# Patient Record
Sex: Male | Born: 1975 | Race: White | Hispanic: No | Marital: Married | Smoking: Former smoker
Health system: Southern US, Community
[De-identification: ages and names within clinical notes are randomized; demographics above are authoritative.]

## PROBLEM LIST (undated history)

## (undated) DIAGNOSIS — I10 Essential (primary) hypertension: Secondary | ICD-10-CM

## (undated) DIAGNOSIS — R42 Dizziness and giddiness: Secondary | ICD-10-CM

## (undated) DIAGNOSIS — F418 Other specified anxiety disorders: Secondary | ICD-10-CM

## (undated) DIAGNOSIS — I4711 Inappropriate sinus tachycardia, so stated: Secondary | ICD-10-CM

## (undated) DIAGNOSIS — R000 Tachycardia, unspecified: Secondary | ICD-10-CM

## (undated) DIAGNOSIS — R030 Elevated blood-pressure reading, without diagnosis of hypertension: Secondary | ICD-10-CM

## (undated) DIAGNOSIS — IMO0001 Reserved for inherently not codable concepts without codable children: Secondary | ICD-10-CM

## (undated) HISTORY — DX: Reserved for inherently not codable concepts without codable children: IMO0001

## (undated) HISTORY — DX: Inappropriate sinus tachycardia, so stated: I47.11

## (undated) HISTORY — DX: Elevated blood-pressure reading, without diagnosis of hypertension: R03.0

## (undated) HISTORY — DX: Tachycardia, unspecified: R00.0

## (undated) HISTORY — DX: Other specified anxiety disorders: F41.8

## (undated) HISTORY — DX: Dizziness and giddiness: R42

---

## 2005-06-20 ENCOUNTER — Other Ambulatory Visit: Payer: Self-pay

## 2005-06-20 ENCOUNTER — Emergency Department: Payer: Self-pay | Admitting: Emergency Medicine

## 2008-05-13 ENCOUNTER — Ambulatory Visit: Payer: Self-pay | Admitting: General Practice

## 2008-06-20 ENCOUNTER — Ambulatory Visit: Payer: Self-pay | Admitting: General Practice

## 2011-03-30 ENCOUNTER — Emergency Department: Payer: Self-pay | Admitting: Emergency Medicine

## 2011-03-30 ENCOUNTER — Ambulatory Visit: Payer: Self-pay

## 2011-04-12 ENCOUNTER — Ambulatory Visit (INDEPENDENT_AMBULATORY_CARE_PROVIDER_SITE_OTHER): Payer: BC Managed Care – PPO | Admitting: Cardiovascular Disease

## 2011-04-12 ENCOUNTER — Encounter: Payer: Self-pay | Admitting: Cardiovascular Disease

## 2011-04-12 DIAGNOSIS — R0602 Shortness of breath: Secondary | ICD-10-CM

## 2011-04-12 DIAGNOSIS — R000 Tachycardia, unspecified: Secondary | ICD-10-CM

## 2011-04-12 DIAGNOSIS — Z87891 Personal history of nicotine dependence: Secondary | ICD-10-CM

## 2011-04-12 DIAGNOSIS — R0789 Other chest pain: Secondary | ICD-10-CM

## 2011-04-12 DIAGNOSIS — Z8249 Family history of ischemic heart disease and other diseases of the circulatory system: Secondary | ICD-10-CM

## 2011-04-12 MED ORDER — DILTIAZEM HCL 30 MG PO TABS: 30.0000 mg | ORAL_TABLET | Freq: Three times a day (TID) | ORAL | Status: DC | PRN

## 2011-04-12 MED ORDER — METOPROLOL TARTRATE 25 MG PO TABS: 25.0000 mg | ORAL_TABLET | Freq: Two times a day (BID) | ORAL | Status: DC | PRN

## 2011-04-12 NOTE — Progress Notes (Signed)
Patient ID: Jordan Sampson, male    DOB: 06-Jul-1975, 35 y.o.   MRN: [id number]  HPI Comments: Jordan Sampson is a pleasant 35 year old gentleman, patient of Dr. Dorothey Baseman, who presents for evaluation of recent tachycardia.  He reports over the past 6 months he has had more than 10 episodes of. Durations and fluttering typically they last for 10 minutes at a time and occur on a random basis with no precipitating factor. 2 weeks ago, he had an episode that lasted one hour. He is relatively asymptomatic when he has these episodes and symptoms occur acutely and stop abruptly. He has not been able to alleviate his symptoms by sitting or changing position. His most recent episode he had shortness of breath. He feels the rhythm is fast and strong. He does not think it is irregular.   He has otherwise been feeling well and has no complaints. He is active with no shortness of breath at baseline.  He did go to the emergency room on November 28. EKG showed normal sinus rhythm with a rate of 69 beats per minute with no significant ST or T wave changes. Blood work was normal including negative cardiac enzymes, normal basic metabolic panel with potassium 3.7  EKG today shows normal sinus rhythm with rate 60 beats per minute with no significant ST or T wave changes, no delta waves   Outpatient Encounter Prescriptions as of 04/12/2011  Medication Sig Dispense Refill  . aspirin 81 MG tablet Take 81 mg by mouth daily.        Marland Kitchen diltiazem (CARDIZEM) 30 MG tablet Take 1 tablet (30 mg total) by mouth 3 (three) times daily as needed.  60 tablet  1  . metoprolol tartrate (LOPRESSOR) 25 MG tablet Take 1 tablet (25 mg total) by mouth 2 (two) times daily as needed.  60 tablet  1   Social Hx:  reports that he quit smoking about 12 years ago. His smokeless tobacco use includes Snuff. He reports that he does not drink alcohol or use illicit drugs.   family history includes Coronary artery disease in his mother; Heart disease  in his mother; and Other (age of onset:52) in his mother.   Review of Systems  Constitutional: Negative.   HENT: Negative.   Eyes: Negative.   Respiratory: Positive for shortness of breath.   Cardiovascular: Positive for palpitations.       Tachycardia  Gastrointestinal: Negative.   Musculoskeletal: Negative.   Skin: Negative.   Neurological: Negative.   Hematological: Negative.   Psychiatric/Behavioral: Negative.   All other systems reviewed and are negative.    BP 120/72  Pulse 68  Ht 6\' 4"  (1.93 m)  Wt 173 lb 12 oz (78.812 kg)  BMI 21.15 kg/m2   Physical Exam  Nursing note and vitals reviewed. Constitutional: He is oriented to person, place, and time. He appears well-developed and well-nourished.  HENT:  Head: Normocephalic.  Nose: Nose normal.  Mouth/Throat: Oropharynx is clear and moist.  Eyes: Conjunctivae are normal. Pupils are equal, round, and reactive to light.  Neck: Normal range of motion. Neck supple. No JVD present.  Cardiovascular: Normal rate, regular rhythm, S1 normal, S2 normal, normal heart sounds and intact distal pulses.  Exam reveals no gallop and no friction rub.   No murmur heard. Pulmonary/Chest: Effort normal and breath sounds normal. No respiratory distress. He has no wheezes. He has no rales. He exhibits no tenderness.  Abdominal: Soft. Bowel sounds are normal. He exhibits no distension. There  is no tenderness.  Musculoskeletal: Normal range of motion. He exhibits no edema and no tenderness.  Lymphadenopathy:    He has no cervical adenopathy.  Neurological: He is alert and oriented to person, place, and time. Coordination normal.  Skin: Skin is warm and dry. No rash noted. No erythema.  Psychiatric: He has a normal mood and affect. His behavior is normal. Judgment and thought content normal.           Assessment and Plan

## 2011-04-12 NOTE — Assessment & Plan Note (Signed)
Etiology of his tachycardia is concerning for SVT or atrial tachycardia. It comes on it regularly and infrequent. We have offered a Holter monitor or 30 day event monitor though he would like to monitor his symptoms for now. We have suggested He called our office for a monitor if his symptoms become more frequent.   We have suggested he go to a fire station or obtain an EKG which we when he is in this rhythm and provide [place] with a copy. If SVT is noted, ablation could be attempted. We have provided a prescription for diltiazem 30 mg p.r.n. As well as metoprolol tartrate 25 mg p.r.n. For symptoms that do not resolve in a short period of time.

## 2011-04-12 NOTE — Assessment & Plan Note (Signed)
He reports having a strong family history of coronary artery disease. He was on a statin in the past though stopped this for uncertain reasons. We have talked about ideal cholesterol levels and he is scheduled to have his lipids drawn in the near future on a routine followup with Dr. Dorothey Baseman.

## 2011-04-12 NOTE — Patient Instructions (Signed)
You are doing well. Take metoprolol and diltiazem as needed for tachycardia  You could have SVT or atrial tachycardia  Please call us if you have new issues that need to be addressed before your next appt.

## 2011-04-12 NOTE — Assessment & Plan Note (Signed)
We have complement him on his ability to stop smoking. He continues to dip.

## 2011-12-08 ENCOUNTER — Ambulatory Visit: Payer: Self-pay | Admitting: General Practice

## 2012-05-01 ENCOUNTER — Emergency Department: Payer: Self-pay | Admitting: Emergency Medicine

## 2012-05-01 LAB — CBC
HCT: 42.4 % (ref 40.0–52.0)
MCHC: 34.5 g/dL (ref 32.0–36.0)
MCV: 89 fL (ref 80–100)
RDW: 14 % (ref 11.5–14.5)

## 2012-05-01 LAB — URINALYSIS, COMPLETE
Leukocyte Esterase: NEGATIVE
Nitrite: NEGATIVE
Ph: 7 (ref 4.5–8.0)
Protein: NEGATIVE
Squamous Epithelial: NONE SEEN

## 2012-05-01 LAB — COMPREHENSIVE METABOLIC PANEL
Albumin: 4.5 g/dL (ref 3.4–5.0)
Anion Gap: 7 (ref 7–16)
Bilirubin,Total: 1.6 mg/dL — ABNORMAL HIGH (ref 0.2–1.0)
Glucose: 92 mg/dL (ref 65–99)
Osmolality: 280 (ref 275–301)
Sodium: 141 mmol/L (ref 136–145)
Total Protein: 8.1 g/dL (ref 6.4–8.2)

## 2012-05-04 ENCOUNTER — Ambulatory Visit: Payer: Self-pay | Admitting: General Practice

## 2012-05-07 ENCOUNTER — Ambulatory Visit: Payer: Self-pay | Admitting: General Practice

## 2012-05-08 ENCOUNTER — Ambulatory Visit: Payer: Self-pay | Admitting: Surgery

## 2012-05-08 LAB — COMPREHENSIVE METABOLIC PANEL
Alkaline Phosphatase: 92 U/L (ref 50–136)
Bilirubin,Total: 1.7 mg/dL — ABNORMAL HIGH (ref 0.2–1.0)
Co2: 33 mmol/L — ABNORMAL HIGH (ref 21–32)
Creatinine: 0.97 mg/dL (ref 0.60–1.30)
EGFR (Non-African Amer.): >60
Potassium: 4.6 mmol/L (ref 3.5–5.1)
SGOT(AST): 20 U/L (ref 15–37)
SGPT (ALT): 31 U/L (ref 12–78)
Sodium: 140 mmol/L (ref 136–145)
Total Protein: 8.3 g/dL — ABNORMAL HIGH (ref 6.4–8.2)

## 2012-05-08 LAB — CBC WITH DIFFERENTIAL/PLATELET
Basophil #: 0.1 10*3/uL (ref 0.0–0.1)
Basophil %: 0.8 %
Eosinophil #: 0.1 10*3/uL (ref 0.0–0.7)
HGB: 14.3 g/dL (ref 13.0–18.0)
MCV: 89 fL (ref 80–100)
Monocyte #: 0.7 x10 3/mm (ref 0.2–1.0)
Monocyte %: 7.4 %

## 2012-05-08 LAB — LIPASE, BLOOD: Lipase: 127 U/L (ref 73–393)

## 2012-05-10 ENCOUNTER — Ambulatory Visit: Payer: Self-pay | Admitting: Gastroenterology

## 2012-05-11 ENCOUNTER — Ambulatory Visit: Payer: Self-pay | Admitting: Surgery

## 2012-05-22 ENCOUNTER — Ambulatory Visit: Payer: Self-pay | Admitting: General Practice

## 2012-05-22 LAB — CREATININE, SERUM: EGFR (Non-African Amer.): >60

## 2012-05-23 ENCOUNTER — Ambulatory Visit: Payer: Self-pay | Admitting: General Practice

## 2012-05-23 DIAGNOSIS — I059 Rheumatic mitral valve disease, unspecified: Secondary | ICD-10-CM

## 2012-05-23 DIAGNOSIS — R079 Chest pain, unspecified: Secondary | ICD-10-CM

## 2012-05-29 ENCOUNTER — Encounter: Payer: Self-pay | Admitting: Internal Medicine

## 2012-05-29 ENCOUNTER — Ambulatory Visit (INDEPENDENT_AMBULATORY_CARE_PROVIDER_SITE_OTHER): Payer: BC Managed Care – PPO | Admitting: Internal Medicine

## 2012-05-29 VITALS — BP 137/87 | HR 89 | Ht 76.0 in | Wt 177.2 lb

## 2012-05-29 DIAGNOSIS — R000 Tachycardia, unspecified: Secondary | ICD-10-CM

## 2012-05-29 DIAGNOSIS — F341 Dysthymic disorder: Secondary | ICD-10-CM

## 2012-05-29 DIAGNOSIS — R42 Dizziness and giddiness: Secondary | ICD-10-CM

## 2012-05-29 DIAGNOSIS — R030 Elevated blood-pressure reading, without diagnosis of hypertension: Secondary | ICD-10-CM

## 2012-05-29 DIAGNOSIS — I498 Other specified cardiac arrhythmias: Secondary | ICD-10-CM

## 2012-05-29 DIAGNOSIS — F418 Other specified anxiety disorders: Secondary | ICD-10-CM | POA: Insufficient documentation

## 2012-05-29 DIAGNOSIS — I471 Supraventricular tachycardia: Secondary | ICD-10-CM

## 2012-05-29 NOTE — Assessment & Plan Note (Signed)
As above.

## 2012-05-29 NOTE — Progress Notes (Signed)
ELECTROPHYSIOLOGY CONSULT NOTE  Patient ID: Jordan Sampson, MRN: [id number], DOB/AGE: 1976-02-02 37 y.o. Admit date: (Not on file) Date of Consult: 05/29/2012  Primary Physician: Jordan Reek, MD Primary Cardiologist: Jordan Sampson  Chief Complaint:  spells   HPI Jordan Sampson is a 37 y.o. male seen at the request of Dr Jordan Sampson because of spells. The patient has a history of spells that dates back numbers of months. There has been orthostatic lightheadedness and showers associated lightheadedness. In December he began having spells which were quite different and were characterized by chills and coldness in his arms and his legs accompanied by dizziness and eructation. He in fact required complaint is of some of these occasions  He would have a residual occipital headache. First of these episodes occurred in the next day he was in the emergency room. Evaluation is not clear but apparently there was an elevated transaminase and he was referred for further evaluation of his gallbladder testing of which was unrevealing. He was thought to be having problems with anxiety and depression which antecedent issues but has not therapy before and he was treated with anxiolytics and antidepressants; there has been no interval improvement.  He was unable to work for a week and half. She has significant dizziness that seems sometimes to be worse lying and sitting or standing.  There was a vertiginous symptoms these episodes.  He came to cardiac attention because of his complaints of chest pain. His electrocardiogram was essentially unremarkable except for a small the flexion of the ST segment of lead V1. He was admitted for treadmill testing in his heart rate went from a baseline of 84-176 2 minutes and the blood pressure went from 154-201. He's had problems with diaphoresis of his hands and feet.  We don't have blood work available from Jordan Sampson but I presume that thyroid studies and CBC are  normal.  Echocardiogram normal with both structure and function. Brain MRI was also normal.     Past Medical History  Diagnosis Date  . Arrhythmia     undetermined cardiac arrhythmia      Surgical History: No past surgical history on file.   Home Meds: Prior to Admission medications   Medication Sig Start Date End Date Taking? Authorizing Provider  clonazePAM (KLONOPIN) 0.5 MG tablet Take 0.25 mg by mouth 2 (two) times daily.   Yes Historical Provider, MD  esomeprazole (NEXIUM) 20 MG capsule Take 20 mg by mouth daily before breakfast.   Yes Historical Provider, MD  loratadine (CLARITIN) 10 MG tablet Take 10 mg by mouth daily.   Yes Historical Provider, MD  sertraline (ZOLOFT) 100 MG tablet Take 100 mg by mouth daily.   Yes Historical Provider, MD      Allergies: No Known Allergies  History   Social History  . Marital Status: Married    Spouse Name: N/A    Number of Children: N/A  . Years of Education: N/A   Occupational History  . Not on file.   Social History Main Topics  . Smoking status: Former Smoker -- 1.0 packs/day for 4 years    Quit date: 04/12/1999  . Smokeless tobacco: Current User    Types: Snuff     Comment: 1 Can daily  . Alcohol Use: No  . Drug Use: No  . Sexually Active: Not on file   Other Topics Concern  . Not on file   Social History Narrative  . No narrative on file     Family  History  Problem Relation Age of Onset  . Coronary artery disease Mother     less than 38 yo  . Other Mother 49    triple bypass surgery  . Heart disease Mother      ROS:  Please see the history of present illness. All other systems reviewed and negative.    Physical Exam:  Blood pressure 137/87, pulse 89, height 6\' 4"  (1.93 m), weight 177 lb 3.2 oz (80.377 kg). General: Well developed, well nourished male in no acute distress. Head: Normocephalic, atraumatic, sclera non-icteric, no xanthomas, nares are without discharge. Lymph Nodes:  none Back:  without scoliosis/kyphosis , no CVA tendersness Neck: Negative for carotid bruits. JVD not elevated. Lungs: Clear bilaterally to auscultation without wheezes, rales, or rhonchi. Breathing is unlabored. Heart: RRR with S1 S2. No murmur , rubs, or gallops appreciated. Abdomen: Soft, non-tender, non-distended with normoactive bowel sounds. No hepatomegaly. No rebound/guarding. No obvious abdominal masses. Msk:  Strength and tone appear normal for age. Extremities: No clubbing or cyanosis. No edema.  Distal pedal pulses are 2+ and equal bilaterally. Skin: Warm and Dry Neuro: Alert and oriented X 3. CN III-XII intact Grossly normal sensory and motor function . Psych:  Responds to questions appropriately with a normal affect.      Labs:  Radiology/Studies:  No results found.  EKG:  ECG demonstrates sinus rhythm at 65 Intervals 15/09/39 Perhaps a small R. Prime in lead V1 and  Assessment and Plan:   Jordan Sampson

## 2012-05-29 NOTE — Patient Instructions (Signed)
Your physician has recommended that you wear an event monitor. Event monitors are medical devices that record the heart's electrical activity. Doctors most often [place] these monitors to diagnose arrhythmias. Arrhythmias are problems with the speed or rhythm of the heartbeat. The monitor is a small, portable device. You can wear one while you do your normal daily activities. This is usually used to diagnose what is causing palpitations/syncope (passing out).  Your physician recommends that you have lab work : 24 hour urine collection for metanephrines & catecholamines  Your physician recommends that you schedule a follow-up appointment in: 4 weeks with Dr. Graciela Husbands in the [place] office.

## 2012-05-29 NOTE — Assessment & Plan Note (Addendum)
The patient is having spells that I don't quite understand. The objective finding of profound tachycardia with standing at 2 minutes of exercise associated with hypertension is concerning for postural tachycardia perhaps the hyper adrenergic form, i.e. POTS  Is also some concern that there may be a hyperadrenergic process going on we will measure urine catecholamines and metanephrines to exclude a circulating issue as he also has hypertension.  The breath of his complaints however with the chills extremity pains in his symptoms worse with lying down however create a constellation of symptoms a with which I am unfamiliar.  aThe association of dysautonomia syndrome with anxiety and depression was reviewed the importance of addressing these issues are ongoing basis was reiterated.  We discussed the physiology of autonomic syndrome for POTS. We've discussed the importance of fluid repletion. Will also repeat a 24-hour urine study as well as use a monitor given the fact that something about these are very episodicand we want to  make sure it we are not missing a primary arrhythmia disorder which I think is unlikely.  Meals and she was undertaken for testing measuring supine and upright catecholamine/norepinephrine levels

## 2012-05-30 ENCOUNTER — Telehealth: Payer: Self-pay | Admitting: *Deleted

## 2012-05-30 NOTE — Telephone Encounter (Signed)
Event monitor enrolled to be mailed to pt. 05/30/12 TK

## 2012-05-31 ENCOUNTER — Encounter: Payer: Self-pay | Admitting: Internal Medicine

## 2012-05-31 ENCOUNTER — Other Ambulatory Visit (INDEPENDENT_AMBULATORY_CARE_PROVIDER_SITE_OTHER): Payer: BC Managed Care – PPO

## 2012-05-31 DIAGNOSIS — I471 Supraventricular tachycardia: Secondary | ICD-10-CM

## 2012-05-31 DIAGNOSIS — R0989 Other specified symptoms and signs involving the circulatory and respiratory systems: Secondary | ICD-10-CM

## 2012-06-01 ENCOUNTER — Other Ambulatory Visit: Payer: BC Managed Care – PPO

## 2012-06-03 LAB — CATECHOLAMINES, FRACTIONATED, URINE, 24 HOUR
Creatinine, Urine mg/day-CATEUR: 0.93 g/(24.h) (ref 0.63–2.50)
Epinephrine, 24 hr Urine: 8 mcg/24 h (ref 2–24)

## 2012-06-05 LAB — METANEPHRINES, URINE, 24 HOUR: Metaneph Total, Ur: 187 mcg/24 h (ref 115–695)

## 2012-07-03 ENCOUNTER — Ambulatory Visit (INDEPENDENT_AMBULATORY_CARE_PROVIDER_SITE_OTHER): Payer: BC Managed Care – PPO | Admitting: Internal Medicine

## 2012-07-03 ENCOUNTER — Encounter: Payer: Self-pay | Admitting: Internal Medicine

## 2012-07-03 VITALS — BP 130/70 | HR 67 | Ht 76.0 in | Wt 181.8 lb

## 2012-07-03 DIAGNOSIS — R002 Palpitations: Secondary | ICD-10-CM

## 2012-07-03 MED ORDER — PROPRANOLOL HCL ER 60 MG PO CP24: 60.0000 mg | ORAL_CAPSULE | Freq: Every day | ORAL | Status: DC

## 2012-07-03 NOTE — Progress Notes (Signed)
Patient Care Team: Fulton Reek, MD as PCP - General (Unknown Physician Specialty)   HPI  Jordan Sampson is a 37 y.o. 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 We have treated him with salt and water repletion and he is much better. He is also taking antidepressants. He is back at work. His worst time of the day is the morning when he awakens.  Past Medical History  Diagnosis Date  . Inappropriate sinus tachycardia     associated with exercise  . Lightheadedness -spells   . Anxiety associated with depression   . Elevated blood pressure     History reviewed. No pertinent past surgical history.  Current Outpatient Prescriptions  Medication Sig Dispense Refill  . clonazePAM (KLONOPIN) 0.5 MG tablet Take 0.25 mg by mouth 2 (two) times daily.      Marland Kitchen esomeprazole (NEXIUM) 20 MG capsule Take 20 mg by mouth daily before breakfast.      . loratadine (CLARITIN) 10 MG tablet Take 10 mg by mouth daily.      . sertraline (ZOLOFT) 100 MG tablet Take 100 mg by mouth daily.       No current facility-administered medications for this visit.    No Known Allergies  Review of Systems negative except from HPI and PMH  Physical Exam BP 130/70  Pulse 67  Ht 6\' 4"  (1.93 m)  Wt 181 lb 12 oz (82.441 kg)  BMI 22.13 kg/m2 Well developed and nourished in no acute distress HENT normal Neck supple with JVP-flat Clear Regular rate and rhythm, no murmurs or gallops Abd-soft with active BS No Clubbing cyanosis edema Skin-warm and dry A & Oriented  Grossly normal sensory and motor function  Event recorder demonstrates sinus tachycardia and nonspecific arrhythmias ECG demonstrates sinus rhythm at 67 Intervals 15/10/38 Axis is 80 Suggesting right atrial enlargement   Assessment and  Plan

## 2012-07-03 NOTE — Assessment & Plan Note (Addendum)
The patient is much improved on a strategy predicated with diagnosis of dysautonomia and POTS. We will have him liberalize his sodium intake a little bit but with caution given his elevated blood pressures. We'll also give him a prescription for Inderal LA 60 to try to see if that doesn't help take the edge of sinus tachycardia palpitations.  We discussed the impact of ambient heat on symptoms of dysautonomia. We also discussed raising the head of the bed 2-6inches nd drinking a glass of water prior to arising to try to minimize am symptoms

## 2012-07-03 NOTE — Patient Instructions (Addendum)
Your physician wants you to follow-up in: 3 months with Dr. Graciela Husbands. You will receive a reminder letter in the mail two months in advance. If you don't receive a letter, please call our office to schedule the follow-up appointment.  Your physician has recommended you make the following change in your medication:  -Inderal ER 60 mg daily

## 2012-07-03 NOTE — Assessment & Plan Note (Signed)
As above.

## 2012-08-12 ENCOUNTER — Ambulatory Visit: Payer: Self-pay | Admitting: Physician Assistant

## 2012-08-12 LAB — RAPID STREP-A WITH REFLX: Micro Text Report: NEGATIVE

## 2012-10-04 ENCOUNTER — Ambulatory Visit (INDEPENDENT_AMBULATORY_CARE_PROVIDER_SITE_OTHER): Payer: BC Managed Care – PPO | Admitting: Internal Medicine

## 2012-10-04 ENCOUNTER — Encounter: Payer: Self-pay | Admitting: Internal Medicine

## 2012-10-04 VITALS — BP 112/82 | HR 68 | Ht 76.0 in | Wt 173.0 lb

## 2012-10-04 DIAGNOSIS — F341 Dysthymic disorder: Secondary | ICD-10-CM

## 2012-10-04 DIAGNOSIS — R030 Elevated blood-pressure reading, without diagnosis of hypertension: Secondary | ICD-10-CM

## 2012-10-04 DIAGNOSIS — I498 Other specified cardiac arrhythmias: Secondary | ICD-10-CM

## 2012-10-04 DIAGNOSIS — F418 Other specified anxiety disorders: Secondary | ICD-10-CM

## 2012-10-04 DIAGNOSIS — R000 Tachycardia, unspecified: Secondary | ICD-10-CM

## 2012-10-04 NOTE — Assessment & Plan Note (Signed)
Improved. Continue on his SSRI. Is not taking Klonopin

## 2012-10-04 NOTE — Assessment & Plan Note (Signed)
Much improved. He is a low dose Inderal. Another alternative might be clonidine he had significant symptoms

## 2012-10-04 NOTE — Progress Notes (Signed)
Patient Care Team: Fulton Reek, MD as PCP - General (Unknown Physician Specialty)   HPI  Jordan Sampson is a 37 y.o. 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 We have treated him with salt and water repletion and he is much better.   Rx with inderal LA 60 for palps   He continues to feel well. He has some complaints of tingling in his fingertips and toes. This is most notable when he is driving.is also some perioral paresthesias     Past Medical History  Diagnosis Date  . Inappropriate sinus tachycardia     associated with exercise  . Lightheadedness -spells   . Anxiety associated with depression   . Elevated blood pressure     History reviewed. No pertinent past surgical history.  Current Outpatient Prescriptions  Medication Sig Dispense Refill  . esomeprazole (NEXIUM) 20 MG capsule Take 20 mg by mouth daily before breakfast.      . loratadine (CLARITIN) 10 MG tablet Take 10 mg by mouth daily.      . propranolol ER (INDERAL LA) 60 MG 24 hr capsule Take 1 capsule (60 mg total) by mouth daily.  90 capsule  3  . sertraline (ZOLOFT) 100 MG tablet Take 100 mg by mouth daily.       No current facility-administered medications for this visit.    No Known Allergies  Review of Systems negative except from HPI and PMH  Physical Exam BP 112/82  Pulse 68  Ht 6\' 4"  (1.93 m)  Wt 173 lb (78.472 kg)  BMI 21.07 kg/m2 Well developed and nourished in no acute distress HENT normal Neck supple with JVP-flat Clear Regular rate and rhythm, no murmurs or gallops Abd-soft with active BS No Clubbing cyanosis edema Skin-warm and dry A & Oriented  Grossly normal sensory and motor function  ECG demonstrates sinus rhythm at 68 Intervals 15/10/38 Axis 80  Assessment and  Plan

## 2012-10-04 NOTE — Assessment & Plan Note (Signed)
improved

## 2015-07-14 ENCOUNTER — Other Ambulatory Visit: Payer: Self-pay | Admitting: Physician Assistant

## 2015-10-09 ENCOUNTER — Ambulatory Visit: Payer: BLUE CROSS/BLUE SHIELD | Admitting: Anesthesiology

## 2015-10-09 ENCOUNTER — Ambulatory Visit
Admission: RE | Admit: 2015-10-09 | Discharge: 2015-10-09 | Disposition: A | Discharge status: Home / Self Care | Payer: BLUE CROSS/BLUE SHIELD | Source: Ambulatory Visit | Attending: Unknown Physician Specialty | Admitting: Unknown Physician Specialty

## 2015-10-09 ENCOUNTER — Encounter
Admission: RE | Disposition: A | Discharge status: Home / Self Care | Payer: Self-pay | Source: Ambulatory Visit | Attending: Unknown Physician Specialty

## 2015-10-09 ENCOUNTER — Encounter: Payer: Self-pay | Admitting: *Deleted

## 2015-10-09 DIAGNOSIS — Z800 Family history of malignant neoplasm of digestive organs: Secondary | ICD-10-CM | POA: Diagnosis not present

## 2015-10-09 DIAGNOSIS — K648 Other hemorrhoids: Secondary | ICD-10-CM | POA: Insufficient documentation

## 2015-10-09 DIAGNOSIS — I10 Essential (primary) hypertension: Secondary | ICD-10-CM | POA: Diagnosis not present

## 2015-10-09 DIAGNOSIS — Z87891 Personal history of nicotine dependence: Secondary | ICD-10-CM | POA: Insufficient documentation

## 2015-10-09 DIAGNOSIS — Z1211 Encounter for screening for malignant neoplasm of colon: Secondary | ICD-10-CM | POA: Diagnosis present

## 2015-10-09 DIAGNOSIS — F418 Other specified anxiety disorders: Secondary | ICD-10-CM | POA: Diagnosis not present

## 2015-10-09 DIAGNOSIS — Z79899 Other long term (current) drug therapy: Secondary | ICD-10-CM | POA: Insufficient documentation

## 2015-10-09 HISTORY — DX: Essential (primary) hypertension: I10

## 2015-10-09 HISTORY — PX: COLONOSCOPY WITH PROPOFOL: SHX5780

## 2015-10-09 SURGERY — COLONOSCOPY WITH PROPOFOL
Anesthesia: General

## 2015-10-09 MED ORDER — PROPOFOL 500 MG/50ML IV EMUL
INTRAVENOUS | Status: DC | PRN
  Administered 2015-10-09: 100 ug/kg/min via INTRAVENOUS

## 2015-10-09 MED ORDER — PROPOFOL 10 MG/ML IV BOLUS
INTRAVENOUS | Status: DC | PRN
  Administered 2015-10-09: 50 mg via INTRAVENOUS

## 2015-10-09 MED ORDER — FENTANYL CITRATE (PF) 100 MCG/2ML IJ SOLN
INTRAMUSCULAR | Status: DC | PRN
  Administered 2015-10-09: 50 ug via INTRAVENOUS

## 2015-10-09 MED ORDER — MIDAZOLAM HCL 5 MG/5ML IJ SOLN
INTRAMUSCULAR | Status: DC | PRN
  Administered 2015-10-09: 1 mg via INTRAVENOUS

## 2015-10-09 MED ORDER — LIDOCAINE HCL (PF) 2 % IJ SOLN
INTRAMUSCULAR | Status: DC | PRN
  Administered 2015-10-09: 50 mg

## 2015-10-09 MED ORDER — SODIUM CHLORIDE 0.9 % IV SOLN: INTRAVENOUS | Status: DC

## 2015-10-09 MED ORDER — SODIUM CHLORIDE 0.9 % IV SOLN
INTRAVENOUS | Status: DC
  Administered 2015-10-09: 1000 mL via INTRAVENOUS

## 2015-10-09 NOTE — Anesthesia Preprocedure Evaluation (Signed)
Anesthesia Evaluation  Patient identified by MRN, date of birth, ID band Patient awake    Reviewed: Allergy & Precautions, H&P , NPO status , Patient's Chart, lab work & pertinent test results, reviewed documented beta blocker date and time   History of Anesthesia Complications Negative for: history of anesthetic complications  Airway Mallampati: I  TM Distance: >3 FB Neck ROM: full    Dental no notable dental hx. (+) Caps, Teeth Intact   Pulmonary neg pulmonary ROS, former smoker,    Pulmonary exam normal breath sounds clear to auscultation       Cardiovascular Exercise Tolerance: Good (-) hypertension(-) angina(-) CAD, (-) Past MI, (-) Cardiac Stents and (-) CABG Normal cardiovascular exam+ dysrhythmias (Inappropraite [place]cardia assoc with exercise) + Valvular Problems/Murmurs (as a child, grew out of it)  Rhythm:regular Rate:Normal     Neuro/Psych PSYCHIATRIC DISORDERS (Depression and anxiety) negative neurological ROS     GI/Hepatic Neg liver ROS, GERD  ,  Endo/Other  negative endocrine ROS  Renal/GU negative Renal ROS  negative genitourinary   Musculoskeletal   Abdominal   Peds  Hematology negative hematology ROS (+)   Anesthesia Other Findings Past Medical History:   Inappropriate sinus tachycardia (HCC)                          Comment:associated with exercise   Lightheadedness -spells                                      Anxiety associated with depression                           Elevated blood pressure                                      Hypertension                                                 Reproductive/Obstetrics negative OB ROS                             Anesthesia Physical Anesthesia Plan  ASA: II  Anesthesia Plan: General   Post-op Pain Management:    Induction:   Airway Management Planned:   Additional Equipment:   Intra-op Plan:   Post-operative  Plan:   Informed Consent: I have reviewed the patients History and Physical, chart, labs and discussed the procedure including the risks, benefits and alternatives for the proposed anesthesia with the patient or authorized representative who has indicated his/her understanding and acceptance.   Dental Advisory Given  Plan Discussed with: Anesthesiologist, CRNA and Surgeon  Anesthesia Plan Comments:         Anesthesia Quick Evaluation

## 2015-10-09 NOTE — H&P (Signed)
    Primary Care Physician:  Pcp Not In System Primary Gastroenterologist:  Dr. Mechele CollinElliott  Pre-Procedure History & Physical: HPI:  Jordan Sampson is a 40 y.o. male is here for an colonoscopy.   Past Medical History  Diagnosis Date  . Inappropriate sinus tachycardia (HCC)     associated with exercise  . Lightheadedness -spells   . Anxiety associated with depression   . Elevated blood pressure   . Hypertension     History reviewed. No pertinent past surgical history.  Prior to Admission medications   Medication Sig Start Date End Date Taking? Authorizing Provider  esomeprazole (NEXIUM) 20 MG capsule Take 20 mg by mouth daily before breakfast.   Yes Historical Provider, MD  loratadine (CLARITIN) 10 MG tablet Take 10 mg by mouth daily.   Yes Historical Provider, MD  sertraline (ZOLOFT) 100 MG tablet Take 100 mg by mouth daily.   Yes Historical Provider, MD  propranolol ER (INDERAL LA) 60 MG 24 hr capsule Take 1 capsule (60 mg total) by mouth daily. Patient not taking: Reported on 10/09/2015 07/03/12   Duke SalviaSteven C Klein, MD    Allergies as of 09/25/2015  . (No Known Allergies)    Family History  Problem Relation Age of Onset  . Coronary artery disease Mother     less than 42 yo  . Other Mother [id number]    triple bypass surgery  . Heart disease Mother     Social History   Social History  . Marital Status: Married    Spouse Name: N/A  . Number of Children: N/A  . Years of Education: N/A   Occupational History  . Not on file.   Social History Main Topics  . Smoking status: Former Smoker -- 1.00 packs/day for 4 years    Quit date: 04/12/1999  . Smokeless tobacco: Current User    Types: Snuff     Comment: 1 Can daily  . Alcohol Use: No  . Drug Use: No  . Sexual Activity: Not on file   Other Topics Concern  . Not on file   Social History Narrative    Review of Systems: See HPI, otherwise negative ROS  Physical Exam: BP 114/77 mmHg  Pulse 67  Temp(Src) 97.2 F (36.2  C) (Tympanic)  Resp 18  Ht 6\' 4"  (1.93 m)  Wt 90.719 kg (200 lb)  BMI 24.35 kg/m2  SpO2 98% General:   Alert,  pleasant and cooperative in NAD Head:  Normocephalic and atraumatic. Neck:  Supple; no masses or thyromegaly. Lungs:  Clear throughout to auscultation.    Heart:  Regular rate and rhythm. Abdomen:  Soft, nontender and nondistended. Normal bowel sounds, without guarding, and without rebound.   Neurologic:  Alert and  oriented x4;  grossly normal neurologically.  Impression/Plan: Jordan Sampson is here for an colonoscopy to be performed for FH colon cancer in father.  Risks, benefits, limitations, and alternatives regarding  colonoscopy have been reviewed with the patient.  Questions have been answered.  All parties agreeable.   Lynnae PrudeELLIOTT, Linzee Depaul, MD  10/09/2015, 2:06 PM

## 2015-10-09 NOTE — Op Note (Signed)
[facility] Gastroenterology Patient Name: Jordan Sampson Procedure Date: 10/09/2015 2:08 PM MRN: [id number] Account #: [id number] Date of Birth: [birth year] Admit Type: Outpatient Age: 40 Room: [facility] ENDO ROOM 4 Gender: Male Note Status: Finalized Procedure:            Colonoscopy Indications:          Screening for colorectal malignant neoplasm Providers:            Scot Junobert T. Trent Theisen, MD Referring MD:         Bunnie Philipsonsuelo D. Rabinovich (Referring MD) Medicines:            Propofol per Anesthesia Complications:        No immediate complications. Procedure:            Pre-Anesthesia Assessment:                       - After reviewing the risks and benefits, the patient                        was deemed in satisfactory condition to undergo the                        procedure.                       After obtaining informed consent, the colonoscope was                        passed under direct vision. Throughout the procedure,                        the patient's blood pressure, pulse, and oxygen                        saturations were monitored continuously. The                        Colonoscope was introduced through the anus and                        advanced to the the cecum, identified by appendiceal                        orifice and ileocecal valve. The colonoscopy was                        performed without difficulty. The patient tolerated the                        procedure well. The quality of the bowel preparation                        was good. Findings:      Internal hemorrhoids were found during endoscopy. The hemorrhoids were       small and Grade I (internal hemorrhoids that do not prolapse).      The exam was otherwise without abnormality. Impression:           - Internal hemorrhoids.                       - The examination was otherwise  normal.                       - No specimens collected. Recommendation:       - Repeat colonoscopy in 5 years  for screening purposes                        due to family history of colon cancer.Scot Jun, MD 10/09/2015 2:41:15 PM This report has been signed electronically. Number of Addenda: 0 Note Initiated On: 10/09/2015 2:08 PM Scope Withdrawal Time: 0 hours 12 minutes 20 seconds  Total Procedure Duration: 0 hours 22 minutes 56 seconds       [facility]

## 2015-10-09 NOTE — Transfer of Care (Signed)
Immediate Anesthesia Transfer of Care Note  Patient: Jordan Sampson  Procedure(s) Performed: Procedure(s): COLONOSCOPY WITH PROPOFOL (N/A)  Patient Location: PACU  Anesthesia Type:General  Level of Consciousness: sedated  Airway & Oxygen Therapy: Patient Spontanous Breathing and Patient connected to nasal cannula oxygen  Post-op Assessment: Report given to RN and Post -op Vital signs reviewed and stable  Post vital signs: Reviewed and stable  Last Vitals:  Filed Vitals:   10/09/15 1330  BP: 114/77  Pulse: 67  Temp: 36.2 C  Resp: 18    Last Pain: There were no vitals filed for this visit.       Complications: No apparent anesthesia complications

## 2015-10-10 NOTE — Anesthesia Postprocedure Evaluation (Signed)
Anesthesia Post Note  Patient: Jordan Sampson  Procedure(s) Performed: Procedure(s) (LRB): COLONOSCOPY WITH PROPOFOL (N/A)  Patient location during evaluation: Endoscopy Anesthesia Type: General Level of consciousness: awake and alert Pain management: pain level controlled Vital Signs Assessment: post-procedure vital signs reviewed and stable Respiratory status: spontaneous breathing, nonlabored ventilation, respiratory function stable and patient connected to nasal cannula oxygen Cardiovascular status: blood pressure returned to baseline and stable Postop Assessment: no signs of nausea or vomiting Anesthetic complications: no    Last Vitals:  Filed Vitals:   10/09/15 1501 10/09/15 1511  BP: 118/81 125/77  Pulse: 65 63  Temp:    Resp: 11 20    Last Pain: There were no vitals filed for this visit.               Lenard SimmerAndrew Taleeya Blondin

## 2015-10-12 ENCOUNTER — Encounter: Payer: Self-pay | Admitting: Unknown Physician Specialty

## 2016-01-23 DIAGNOSIS — R000 Tachycardia, unspecified: Secondary | ICD-10-CM | POA: Insufficient documentation

## 2016-01-23 DIAGNOSIS — F418 Other specified anxiety disorders: Secondary | ICD-10-CM | POA: Insufficient documentation

## 2016-01-23 DIAGNOSIS — I4711 Inappropriate sinus tachycardia, so stated: Secondary | ICD-10-CM | POA: Insufficient documentation

## 2016-01-23 DIAGNOSIS — IMO0001 Reserved for inherently not codable concepts without codable children: Secondary | ICD-10-CM | POA: Insufficient documentation

## 2018-02-15 ENCOUNTER — Emergency Department: Payer: BLUE CROSS/BLUE SHIELD

## 2018-02-15 ENCOUNTER — Emergency Department
Admission: EM | Admit: 2018-02-15 | Discharge: 2018-02-15 | Disposition: A | Discharge status: Home / Self Care | Payer: BLUE CROSS/BLUE SHIELD | Attending: Emergency Medicine | Admitting: Emergency Medicine

## 2018-02-15 ENCOUNTER — Encounter: Payer: Self-pay | Admitting: Emergency Medicine

## 2018-02-15 DIAGNOSIS — R42 Dizziness and giddiness: Secondary | ICD-10-CM | POA: Diagnosis not present

## 2018-02-15 DIAGNOSIS — G44309 Post-traumatic headache, unspecified, not intractable: Secondary | ICD-10-CM | POA: Diagnosis not present

## 2018-02-15 DIAGNOSIS — I10 Essential (primary) hypertension: Secondary | ICD-10-CM | POA: Diagnosis not present

## 2018-02-15 DIAGNOSIS — F0781 Postconcussional syndrome: Secondary | ICD-10-CM | POA: Insufficient documentation

## 2018-02-15 DIAGNOSIS — Y9389 Activity, other specified: Secondary | ICD-10-CM | POA: Diagnosis not present

## 2018-02-15 DIAGNOSIS — Y999 Unspecified external cause status: Secondary | ICD-10-CM | POA: Insufficient documentation

## 2018-02-15 DIAGNOSIS — M25521 Pain in right elbow: Secondary | ICD-10-CM | POA: Insufficient documentation

## 2018-02-15 DIAGNOSIS — Z79899 Other long term (current) drug therapy: Secondary | ICD-10-CM | POA: Insufficient documentation

## 2018-02-15 DIAGNOSIS — S161XXA Strain of muscle, fascia and tendon at neck level, initial encounter: Secondary | ICD-10-CM | POA: Diagnosis not present

## 2018-02-15 DIAGNOSIS — R55 Syncope and collapse: Secondary | ICD-10-CM | POA: Diagnosis not present

## 2018-02-15 DIAGNOSIS — Y92410 Unspecified street and highway as the place of occurrence of the external cause: Secondary | ICD-10-CM | POA: Insufficient documentation

## 2018-02-15 DIAGNOSIS — S199XXA Unspecified injury of neck, initial encounter: Secondary | ICD-10-CM | POA: Diagnosis present

## 2018-02-15 DIAGNOSIS — Z87891 Personal history of nicotine dependence: Secondary | ICD-10-CM | POA: Diagnosis not present
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
## 2018-11-17 DIAGNOSIS — Z20828 Contact with and (suspected) exposure to other viral communicable diseases: Secondary | ICD-10-CM | POA: Diagnosis not present

## 2018-11-21 ENCOUNTER — Telehealth: Payer: Self-pay

## 2018-11-21 NOTE — Telephone Encounter (Signed)
In the last 24 hours have you experienced any of these symptoms . Difficulty breathing - no . Nasal congestion - yes . New cough - yes . Runny nose - no . Shortness of breath - no . New sore throat - np . Unexplained body aches - yes  . Nausea or vomiting - no  . Diarrhea - yes . Loss of taste or smell - no . Fever (temp>100 F/37.8 C) or chills - last day fever sunday  Exposure:  . Have you been in close contact with someone with confirmed diagnosis of COVID or person under going testing in past 14 days? Last week some at work was positive, in and out of shop wearing mask.  . Have you been tested for COVID? If yes date, location, results in known - tested on 11/17/18 at [facility] urgent care, doesn't have results yet.  . Have you been living in the same home as a person with confirmed diagnosis of COVID or a person undergoing testing? (household contact) no   . Have you been diagnosed with COVID or are you waiting on test results? Waiting test results  . Have you traveled anywhere in the past 4 weeks? If yes where - no   Pt currently taking tylenol, IBU took tylenol at noon today. Everyone in the shop was sent for testing. His sx started Friday night. Has been out of work since last Thursday.  He is wanting to know what he can take for sx and if he needs to do anything else but wait for results.

## 2018-11-21 NOTE — Telephone Encounter (Signed)
Pt aware, will hold note until we have results

## 2018-11-21 NOTE — Telephone Encounter (Signed)
Continue with symptomatic treatment and to let us know of results when they return. Tylenol for fevers, can use a mucinex or robitussin type product for congestion/cough. Increased fluids important and rest and immodium product ok for diarrhea short term prn.

## 2018-11-22 NOTE — Telephone Encounter (Signed)
Pt advised that HD will call him and if he has questions he can call me back. I will call and check on him in a few days.

## 2018-11-22 NOTE — Telephone Encounter (Signed)
Pt called back today and his COVID test is positive [facility] advised pt HD will be in contact.

## 2018-11-22 NOTE — Telephone Encounter (Signed)
The HD will be helpful with return to work (the [employer] guidelines have very recently changed and I sent them to you via recent email). We need to follow those guidelines at a minimum pending further HD input.

## 2018-11-26 NOTE — Telephone Encounter (Signed)
Pt will take guidance from HD as to when to return to work

## 2018-11-28 ENCOUNTER — Telehealth: Payer: Self-pay | Admitting: Internal Medicine

## 2018-11-28 ENCOUNTER — Telehealth: Payer: Self-pay

## 2018-11-28 NOTE — Telephone Encounter (Signed)
See next note

## 2018-11-28 NOTE — Telephone Encounter (Signed)
Pt called states that HD called and said he can come off isolation. He has had sx for 12 days now, tested positive on 11/21/2018. HD told him he is no longer contagious  But to follow up with [place] about returning to work.  He is still having congestion and body aches and says he doesn't feel great. Fever free X 5 days but has been taking tylenol as recent as yesterday.   Advised that guidelines state he needs to be fever free off meds for 24 hours and needs to be 10 days since sx started. He is just concerned since he still is congested and has body aches if he should return to work

## 2018-11-28 NOTE — Telephone Encounter (Signed)
Patient called and left VM and returned my call. He was tested for Covid Sat 7/18 and informed + on 7/22. He noted this was the sickest he has been and is still struggling with getting his strength back, still congested some and weak, cough is better, no more fevers, no body aches presently. No loss of smell or taste. Not taking any meds presently. He is concerned with trying to go back to work just yet.  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 Of note, his wife was also + and she has not been symptomatic.  The HD noted to him he no longer needs to remain in quarantine and suggested contactin [place] about RTW.   Given how he feels and how sick he was, I agree that rushing back to work is not best and probably needs a few more days before returning. Covid can persist for some longer than a typical infection has been noted.  Agreed to continue with symptomatic management with rest and fluids important part of that, and will tentatively plan on return to work on Monday 12/03/2018. Form completed. If not improving and feeling better to return by then, he is to notify us and if more concerning sx's arise or worsening again, may need to f/u more urgently to an ER setting.

## 2018-11-28 NOTE — Telephone Encounter (Signed)
Message reviewed. Patient was Covid positive 11/21/2018. Noted still having some sx's and had questions about return to work.  I called to talk to him at 11:40 am and left a VM to return my call.

## 2018-11-30 NOTE — Telephone Encounter (Signed)
Pt is feeling better and plans to RTW on Monday 12/03/2018 he has note from HD.

## 2019-01-31 ENCOUNTER — Ambulatory Visit: Payer: Self-pay

## 2019-01-31 DIAGNOSIS — Z23 Encounter for immunization: Secondary | ICD-10-CM

## 2019-03-05 ENCOUNTER — Other Ambulatory Visit: Payer: Self-pay

## 2019-03-05 DIAGNOSIS — F329 Major depressive disorder, single episode, unspecified: Secondary | ICD-10-CM

## 2019-03-05 DIAGNOSIS — F32A Depression, unspecified: Secondary | ICD-10-CM

## 2019-03-06 MED ORDER — SERTRALINE HCL 100 MG PO TABS: 200.0000 mg | ORAL_TABLET | Freq: Every day | ORAL | 0 refills | Status: DC

## 2019-03-06 NOTE — Telephone Encounter (Signed)
Patient had face to face with Dr Roxan Hockey Feb 2020.  On sertraline since 2014 per paper chart review at [facility].  Electronic Rx sent for patient refill.  Due labs/appt Feb/Mar 2021 sooner if new or worsening symptoms.

## 2019-04-17 ENCOUNTER — Other Ambulatory Visit: Payer: Self-pay

## 2019-04-22 ENCOUNTER — Other Ambulatory Visit: Payer: Self-pay | Admitting: Physician Assistant

## 2019-04-22 MED ORDER — MONTELUKAST SODIUM 10 MG PO TABS: 10.0000 mg | ORAL_TABLET | Freq: Every day | ORAL | 1 refills | Status: DC

## 2019-04-22 NOTE — Progress Notes (Signed)
UTD visits Saw Dr Roxan Hockey 07/1998 Uses Rx for rhinitis/allergies daily

## 2019-06-05 ENCOUNTER — Other Ambulatory Visit: Payer: Self-pay

## 2019-06-05 ENCOUNTER — Ambulatory Visit: Payer: Self-pay

## 2019-06-05 DIAGNOSIS — Z0000 Encounter for general adult medical examination without abnormal findings: Secondary | ICD-10-CM

## 2019-06-05 LAB — POCT URINALYSIS DIPSTICK
Bilirubin, UA: NEGATIVE
Blood, UA: NEGATIVE
Glucose, UA: NEGATIVE
Ketones, UA: NEGATIVE
Leukocytes, UA: NEGATIVE
Nitrite, UA: NEGATIVE
Protein, UA: NEGATIVE
Spec Grav, UA: 1.015 (ref 1.010–1.025)
Urobilinogen, UA: 0.2 E.U./dL
pH, UA: 7.5 (ref 5.0–8.0)

## 2019-06-05 NOTE — Progress Notes (Signed)
Patient is here today for pre physical labs,hearing test and EKG. Patient is scheduled for a physical with Dr.Hunt on 06/11/19.

## 2019-06-05 NOTE — Addendum Note (Signed)
Addended by: Nida Boatman on: 06/05/2019 11:10 AM   Modules accepted: Orders

## 2019-06-06 LAB — CMP12+LP+TP+TSH+6AC+PSA+CBC…
ALT: 37 IU/L (ref 0–44)
AST: 30 IU/L (ref 0–40)
Albumin/Globulin Ratio: 2 (ref 1.2–2.2)
Albumin: 4.8 g/dL (ref 4.0–5.0)
Alkaline Phosphatase: 99 IU/L (ref 39–117)
BUN/Creatinine Ratio: 11 (ref 9–20)
Basos: 1 %
Calcium: 9.3 mg/dL (ref 8.7–10.2)
Chloride: 102 mmol/L (ref 96–106)
Chol/HDL Ratio: 4.3 ratio (ref 0.0–5.0)
Free Thyroxine Index: 1.5 (ref 1.2–4.9)
GFR calc Af Amer: 114 mL/min/{1.73_m2} (ref >59)
GFR calc non Af Amer: 99 mL/min/{1.73_m2} (ref >59)
Globulin, Total: 2.4 g/dL (ref 1.5–4.5)
Glucose: 95 mg/dL (ref 65–99)
Immature Grans (Abs): 0 10*3/uL (ref 0.0–0.1)
Immature Granulocytes: 0 %
LDH: 190 IU/L (ref 121–224)
LDL Chol Calc (NIH): 94 mg/dL (ref 0–99)
Lymphs: 34 %
MCHC: 34 g/dL (ref 31.5–35.7)
Monocytes: 9 %
Neutrophils Absolute: 3.7 10*3/uL (ref 1.4–7.0)
Phosphorus: 2.5 mg/dL — ABNORMAL LOW (ref 2.8–4.1)
Potassium: 4.8 mmol/L (ref 3.5–5.2)
Prostate Specific Ag, Serum: 0.4 ng/mL (ref 0.0–4.0)
Sodium: 139 mmol/L (ref 134–144)
TSH: 1.18 u[IU]/mL (ref 0.450–4.500)
WBC: 6.7 10*3/uL (ref 3.4–10.8)

## 2019-06-06 LAB — CMP12+LP+TP+TSH+6AC+PSA+CBC?
BUN: 10 mg/dL (ref 6–24)
Basophils Absolute: 0.1 10*3/uL (ref 0.0–0.2)
Bilirubin Total: 1.3 mg/dL — ABNORMAL HIGH (ref 0.0–1.2)
Cholesterol, Total: 154 mg/dL (ref 100–199)
Creatinine, Ser: 0.93 mg/dL (ref 0.76–1.27)
EOS (ABSOLUTE): 0.1 10*3/uL (ref 0.0–0.4)
Eos: 2 %
Estimated CHD Risk: 0.8 times avg. (ref 0.0–1.0)
GGT: 24 IU/L (ref 0–65)
HDL: 36 mg/dL — ABNORMAL LOW (ref >39)
Hematocrit: 43 % (ref 37.5–51.0)
Hemoglobin: 14.6 g/dL (ref 13.0–17.7)
Iron: 140 ug/dL (ref 38–169)
Lymphocytes Absolute: 2.3 10*3/uL (ref 0.7–3.1)
MCH: 29.9 pg (ref 26.6–33.0)
MCV: 88 fL (ref 79–97)
Monocytes Absolute: 0.6 10*3/uL (ref 0.1–0.9)
Neutrophils: 54 %
Platelets: 304 10*3/uL (ref 150–450)
RBC: 4.88 x10E6/uL (ref 4.14–5.80)
RDW: 13.1 % (ref 11.6–15.4)
T3 Uptake Ratio: 23 % — ABNORMAL LOW (ref 24–39)
T4, Total: 6.5 ug/dL (ref 4.5–12.0)
Total Protein: 7.2 g/dL (ref 6.0–8.5)
Triglycerides: 136 mg/dL (ref 0–149)
Uric Acid: 4.5 mg/dL (ref 3.8–8.4)
VLDL Cholesterol Cal: 24 mg/dL (ref 5–40)

## 2019-06-09 ENCOUNTER — Encounter: Payer: Self-pay | Admitting: Registered Nurse

## 2019-06-09 ENCOUNTER — Other Ambulatory Visit: Payer: Self-pay | Admitting: Registered Nurse

## 2019-06-09 DIAGNOSIS — F32A Depression, unspecified: Secondary | ICD-10-CM

## 2019-06-09 DIAGNOSIS — F329 Major depressive disorder, single episode, unspecified: Secondary | ICD-10-CM

## 2019-06-09 NOTE — Telephone Encounter (Signed)
Appt pending 06/11/2019 with [facility] provider.  Has been on medication since 2014.  Electronic Rx sent to his pharmacy of choice sertraline 100mg  sig t2 po daily #180 RF0  Labs completed 06/05/2019

## 2019-06-11 ENCOUNTER — Other Ambulatory Visit: Payer: Self-pay

## 2019-06-11 ENCOUNTER — Ambulatory Visit: Payer: Self-pay | Admitting: Occupational Medicine

## 2019-06-11 VITALS — BP 130/82 | HR 68 | Temp 98.5°F | Ht 76.0 in | Wt 212.2 lb

## 2019-06-11 DIAGNOSIS — Z0000 Encounter for general adult medical examination without abnormal findings: Secondary | ICD-10-CM

## 2019-06-11 NOTE — Progress Notes (Signed)
  Subjective:     Patient ID: Jordan Sampson, male   DOB: 01-15-1976, 44 y.o.   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  He has been in this position for the last 20 years.  Presents for his annual physical at the [facility] of [facility] medical clinic.  Past medical history: Anxiety, allergies, and dyslipidemia.  Current medications include Zoloft, Flonase, Singulair, Nexium, and Lipitor.  He is enjoyed good health in the past year with the exception of an Covid illness in the summer.  He recovered fully, but did gain a healthy respect for this awful disease and is looking forward to being vaccinated when available. Review of Systems Denies fever, weight loss, weight gain, fatigue, muscle pain, chest pain, shortness of breath.    Objective:   Physical Exam Today's Vitals   06/11/19 1514  BP: 130/82  Pulse: 68  Temp: 98.5 F (36.9 C)  TempSrc: Oral  SpO2: 97%  Weight: 212 lb 3.2 oz (96.3 kg)  Height: 6\' 4"  (1.93 m)   Body mass index is 25.83 kg/m.   Normal adult male exam Assessment:     Anxiety, allergies, and dyslipidemia well controlled on medications.  Labs drawn last week are normal.    Plan:     Continue current medications.  He is due for his colonoscopy in 10 years.  He did have one in 2019 due to family history of colon cancer.  Tetanus shot up-to-date in 2015.

## 2019-06-19 ENCOUNTER — Other Ambulatory Visit: Payer: Self-pay

## 2019-06-19 ENCOUNTER — Other Ambulatory Visit: Payer: Self-pay | Admitting: Registered Nurse

## 2019-06-19 ENCOUNTER — Encounter: Payer: Self-pay | Admitting: Registered Nurse

## 2019-06-19 DIAGNOSIS — F329 Major depressive disorder, single episode, unspecified: Secondary | ICD-10-CM

## 2019-06-19 DIAGNOSIS — F32A Depression, unspecified: Secondary | ICD-10-CM

## 2019-06-19 DIAGNOSIS — E785 Hyperlipidemia, unspecified: Secondary | ICD-10-CM

## 2019-06-19 MED ORDER — ATORVASTATIN CALCIUM 20 MG PO TABS: 20.0000 mg | ORAL_TABLET | Freq: Every morning | ORAL | 3 refills | Status: DC

## 2019-06-19 NOTE — Telephone Encounter (Signed)
Last seen by Dr Alto Denver 06/11/2019  Labs 06/05/2019  Liver enzymes stable/normal; HDL low LDL 94  Follow up in 1 year.

## 2019-06-19 NOTE — Telephone Encounter (Signed)
Patient last physical Dr Alto Denver 06/11/2019 stated well controlled on meds for anxiety; per paper chart review he has been on sertraline since 2014.  Electronic Rx sertraline 100mg  sig t2 po daily #180 RF3 to his pharmacy of choice.

## 2019-06-27 NOTE — Progress Notes (Signed)
Patient comes in today for a repeat hearing test.

## 2019-06-28 ENCOUNTER — Other Ambulatory Visit: Payer: Self-pay

## 2019-06-28 ENCOUNTER — Ambulatory Visit: Payer: Self-pay

## 2019-06-28 DIAGNOSIS — Z01110 Encounter for hearing examination following failed hearing screening: Secondary | ICD-10-CM

## 2019-09-19 ENCOUNTER — Other Ambulatory Visit: Payer: Self-pay

## 2019-09-19 DIAGNOSIS — J302 Other seasonal allergic rhinitis: Secondary | ICD-10-CM

## 2019-09-19 MED ORDER — MONTELUKAST SODIUM 10 MG PO TABS: 10.0000 mg | ORAL_TABLET | Freq: Every day | ORAL | 3 refills | Status: DC

## 2020-03-18 ENCOUNTER — Ambulatory Visit: Payer: Self-pay

## 2020-03-18 DIAGNOSIS — Z23 Encounter for immunization: Secondary | ICD-10-CM

## 2020-04-30 ENCOUNTER — Other Ambulatory Visit: Payer: Self-pay

## 2020-04-30 DIAGNOSIS — E785 Hyperlipidemia, unspecified: Secondary | ICD-10-CM

## 2020-04-30 MED ORDER — ATORVASTATIN CALCIUM 20 MG PO TABS: 20.0000 mg | ORAL_TABLET | Freq: Every morning | ORAL | 1 refills | Status: DC

## 2020-05-15 ENCOUNTER — Ambulatory Visit: Payer: Self-pay

## 2020-05-15 ENCOUNTER — Other Ambulatory Visit: Payer: Self-pay

## 2020-05-15 DIAGNOSIS — Z0000 Encounter for general adult medical examination without abnormal findings: Secondary | ICD-10-CM

## 2020-05-15 DIAGNOSIS — Z1152 Encounter for screening for COVID-19: Secondary | ICD-10-CM

## 2020-05-15 LAB — POCT URINALYSIS DIPSTICK
Bilirubin, UA: NEGATIVE
Blood, UA: NEGATIVE
Glucose, UA: NEGATIVE
Ketones, UA: NEGATIVE
Leukocytes, UA: NEGATIVE
Nitrite, UA: NEGATIVE
Protein, UA: NEGATIVE
Spec Grav, UA: 1.015 (ref 1.010–1.025)
Urobilinogen, UA: 0.2 E.U./dL
pH, UA: 8 (ref 5.0–8.0)

## 2020-05-16 LAB — CMP12+LP+TP+TSH+6AC+PSA+CBC…
ALT: 31 IU/L (ref 0–44)
AST: 23 IU/L (ref 0–40)
Albumin/Globulin Ratio: 2 (ref 1.2–2.2)
Albumin: 4.8 g/dL (ref 4.0–5.0)
Alkaline Phosphatase: 92 IU/L (ref 44–121)
BUN/Creatinine Ratio: 14 (ref 9–20)
BUN: 11 mg/dL (ref 6–24)
Basophils Absolute: 0.1 10*3/uL (ref 0.0–0.2)
Basos: 1 %
Bilirubin Total: 1.2 mg/dL (ref 0.0–1.2)
Calcium: 9.4 mg/dL (ref 8.7–10.2)
Chloride: 101 mmol/L (ref 96–106)
Chol/HDL Ratio: 4.1 ratio (ref 0.0–5.0)
Cholesterol, Total: 155 mg/dL (ref 100–199)
Creatinine, Ser: 0.79 mg/dL (ref 0.76–1.27)
EOS (ABSOLUTE): 0.2 10*3/uL (ref 0.0–0.4)
Eos: 2 %
Estimated CHD Risk: 0.8 times avg. (ref 0.0–1.0)
Free Thyroxine Index: 1.7 (ref 1.2–4.9)
GFR calc Af Amer: 124 mL/min/{1.73_m2} (ref >59)
GFR calc non Af Amer: 108 mL/min/{1.73_m2} (ref >59)
GGT: 24 IU/L (ref 0–65)
Globulin, Total: 2.4 g/dL (ref 1.5–4.5)
Glucose: 96 mg/dL (ref 65–99)
HDL: 38 mg/dL — ABNORMAL LOW (ref >39)
Hematocrit: 42 % (ref 37.5–51.0)
Hemoglobin: 14.2 g/dL (ref 13.0–17.7)
Immature Grans (Abs): 0 10*3/uL (ref 0.0–0.1)
Immature Granulocytes: 0 %
Iron: 123 ug/dL (ref 38–169)
LDH: 136 IU/L (ref 121–224)
LDL Chol Calc (NIH): 96 mg/dL (ref 0–99)
Lymphocytes Absolute: 2.4 10*3/uL (ref 0.7–3.1)
Lymphs: 32 %
MCH: 29.7 pg (ref 26.6–33.0)
MCHC: 33.8 g/dL (ref 31.5–35.7)
MCV: 88 fL (ref 79–97)
Monocytes Absolute: 0.6 10*3/uL (ref 0.1–0.9)
Monocytes: 8 %
Neutrophils Absolute: 4.2 10*3/uL (ref 1.4–7.0)
Neutrophils: 57 %
Phosphorus: 2.5 mg/dL — ABNORMAL LOW (ref 2.8–4.1)
Platelets: 282 10*3/uL (ref 150–450)
Potassium: 4.1 mmol/L (ref 3.5–5.2)
Prostate Specific Ag, Serum: 0.4 ng/mL (ref 0.0–4.0)
RBC: 4.78 x10E6/uL (ref 4.14–5.80)
RDW: 13.3 % (ref 11.6–15.4)
Sodium: 138 mmol/L (ref 134–144)
T3 Uptake Ratio: 24 % (ref 24–39)
T4, Total: 6.9 ug/dL (ref 4.5–12.0)
TSH: 1.23 u[IU]/mL (ref 0.450–4.500)
Total Protein: 7.2 g/dL (ref 6.0–8.5)
Triglycerides: 113 mg/dL (ref 0–149)
Uric Acid: 4.6 mg/dL (ref 3.8–8.4)
VLDL Cholesterol Cal: 21 mg/dL (ref 5–40)
WBC: 7.4 10*3/uL (ref 3.4–10.8)

## 2020-05-20 ENCOUNTER — Encounter: Payer: Self-pay | Admitting: Adult Medicine

## 2020-05-20 ENCOUNTER — Other Ambulatory Visit: Payer: Self-pay

## 2020-05-20 ENCOUNTER — Ambulatory Visit: Payer: Self-pay | Admitting: Adult Medicine

## 2020-05-20 VITALS — BP 121/80 | HR 67 | Temp 97.8°F | Resp 14 | Ht 76.0 in | Wt 210.0 lb

## 2020-05-20 DIAGNOSIS — Z0000 Encounter for general adult medical examination without abnormal findings: Secondary | ICD-10-CM

## 2020-05-20 NOTE — Progress Notes (Signed)
   Subjective:    Patient ID: Jordan Sampson, male    DOB: 12/01/1975, 45 y.o.   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  He reports a chronic problem of symptommatic sinus discharge congestion intermittent pain for which he uses    c.  Past medical history: Anxiety, allergies, and dyslipidemia.  Current medications include Zoloft, Flonase, Singulair, Nexium, and Lipitor.  He is enjoyed good health in the past year with the exception of an Covid illness in the summer.  He recovered fully, but did gain a healthy respect for this awful disease and is looking forward to being vaccinated when available. Hx  ent recommended sinus surgery due to chronic work and home  Exposure client deferred rx Med flonase, netty pot Denies dizziness, fever, weight loss, weight gain, fatigue, muscle pain, chest pain, shortness of breath.    Review of Systems     Cardiovascular and Mediastinum   Inappropriate sinus tachycardia/POTS          Family history of premature CAD        Smoking hx        Anxiety associated with depression        Elevated blood pressure         Objective:   Physical Exam Normotensive wf wm nad Heent De [place] at, perrla, eom intact fundi nl nl dentition pharynx clear  thyroid non palp Pulm clear to A&P Cardia nl Pmi no murmer rub ectopy Abd soft bs+ nontender no organomegaly Extr  FRom, nl strength Neuro without focal findings         Assessment & Plan:  Well annual physical exam with clinical nl routine lab Denies exacerbation of depresssion, hypotensive events Use claritin singulair for sinus. 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

## 2020-05-20 NOTE — Progress Notes (Signed)
Pt presents today to complete physical. Pt is requesting an RX refill on atorvastatin 20mg  and azelastine hcl. CL,RMA

## 2020-05-27 ENCOUNTER — Ambulatory Visit: Payer: 59

## 2020-06-01 ENCOUNTER — Ambulatory Visit: Payer: Self-pay

## 2020-06-01 ENCOUNTER — Other Ambulatory Visit: Payer: Self-pay

## 2020-06-01 DIAGNOSIS — Z1152 Encounter for screening for COVID-19: Secondary | ICD-10-CM

## 2020-06-01 NOTE — Progress Notes (Signed)
Presents to [facility] for on-site rapid Covid Test.  +Exposure - Daughter & Wife  States out of work since 05/22/20 5 days for + exposure to daughter 5 days for + exposure to wife  Asymptomatic  Vaccinated - > 6 months & no booster  2 home tests negative 1 PCR at [facility] negative  Rapid Covid Test = Negative  AMD
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
## 2020-08-17 ENCOUNTER — Other Ambulatory Visit: Payer: Self-pay

## 2020-08-17 DIAGNOSIS — F419 Anxiety disorder, unspecified: Secondary | ICD-10-CM

## 2020-08-17 DIAGNOSIS — F32A Depression, unspecified: Secondary | ICD-10-CM

## 2020-08-17 MED ORDER — SERTRALINE HCL 100 MG PO TABS: 2.0000 | ORAL_TABLET | Freq: Every day | ORAL | 3 refills | Status: DC

## 2020-10-09 ENCOUNTER — Telehealth: Payer: BLUE CROSS/BLUE SHIELD | Admitting: Physician Assistant

## 2020-10-09 DIAGNOSIS — J019 Acute sinusitis, unspecified: Secondary | ICD-10-CM

## 2020-10-09 DIAGNOSIS — B9689 Other specified bacterial agents as the cause of diseases classified elsewhere: Secondary | ICD-10-CM

## 2020-10-09 MED ORDER — AMOXICILLIN-POT CLAVULANATE 875-125 MG PO TABS: 1.0000 | ORAL_TABLET | Freq: Two times a day (BID) | ORAL | 0 refills | Status: DC

## 2020-10-09 NOTE — Addendum Note (Signed)
Addended by: Margaretann Loveless on: 10/09/2020 04:47 PM   Modules accepted: Orders

## 2020-10-09 NOTE — Progress Notes (Signed)
We are sorry that you are not feeling well.  Here is how we plan to help!  Based on what you have shared with me it looks like you have sinusitis.  Sinusitis is inflammation and infection in the sinus cavities of the head.  Based on your presentation I believe you most likely have Acute Bacterial Sinusitis.  This is an infection caused by bacteria and is treated with antibiotics. I have prescribed Augmentin 875mg/125mg one tablet twice daily with food, for 7 days. You may use an oral decongestant such as Mucinex D or if you have glaucoma or high blood pressure use plain Mucinex. Saline nasal spray help and can safely be used as often as needed for congestion.  If you develop worsening sinus pain, fever or notice severe headache and vision changes, or if symptoms are not better after completion of antibiotic, please schedule an appointment with a health care provider.    Sinus infections are not as easily transmitted as other respiratory infection, however we still recommend that you avoid close contact with loved ones, especially the very young and elderly.  Remember to wash your hands thoroughly throughout the day as this is the number one way to prevent the spread of infection!  Home Care:  Only take medications as instructed by your medical team.  Complete the entire course of an antibiotic.  Do not take these medications with alcohol.  A steam or ultrasonic humidifier can help congestion.  You can place a towel over your head and breathe in the steam from hot water coming from a faucet.  Avoid close contacts especially the very young and the elderly.  Cover your mouth when you cough or sneeze.  Always remember to wash your hands.  Get Help Right Away If:  You develop worsening fever or sinus pain.  You develop a severe head ache or visual changes.  Your symptoms persist after you have completed your treatment plan.  Make sure you  Understand these instructions.  Will watch your  condition.  Will get help right away if you are not doing well or get worse.  Your e-visit answers were reviewed by a board certified advanced clinical practitioner to complete your personal care plan.  Depending on the condition, your plan could have included both over the counter or prescription medications.  If there is a problem please reply  once you have received a response from your provider.  Your safety is important to us.  If you have drug allergies check your prescription carefully.    You can use MyChart to ask questions about today's visit, request a non-urgent call back, or ask for a work or school excuse for 24 hours related to this e-Visit. If it has been greater than 24 hours you will need to follow up with your provider, or enter a new e-Visit to address those concerns.  You will get an e-mail in the next two days asking about your experience.  I hope that your e-visit has been valuable and will speed your recovery. Thank you for using e-visits.  I provided 5 minutes of non face-to-face time during this encounter for chart review and documentation.   

## 2020-12-29 ENCOUNTER — Other Ambulatory Visit: Payer: Self-pay

## 2020-12-29 DIAGNOSIS — E785 Hyperlipidemia, unspecified: Secondary | ICD-10-CM

## 2020-12-29 MED ORDER — ATORVASTATIN CALCIUM 20 MG PO TABS: 20.0000 mg | ORAL_TABLET | Freq: Every morning | ORAL | 3 refills | Status: DC

## 2020-12-31 DEATH — deceased

## 2021-04-16 ENCOUNTER — Telehealth: Payer: Self-pay | Admitting: Family

## 2021-04-16 ENCOUNTER — Telehealth: Payer: 59 | Admitting: Physician Assistant

## 2021-04-16 DIAGNOSIS — B9689 Other specified bacterial agents as the cause of diseases classified elsewhere: Secondary | ICD-10-CM

## 2021-04-16 DIAGNOSIS — J019 Acute sinusitis, unspecified: Secondary | ICD-10-CM

## 2021-04-16 DIAGNOSIS — J069 Acute upper respiratory infection, unspecified: Secondary | ICD-10-CM

## 2021-04-16 MED ORDER — BENZONATATE 100 MG PO CAPS: 100.0000 mg | ORAL_CAPSULE | Freq: Three times a day (TID) | ORAL | 0 refills | Status: DC | PRN

## 2021-04-16 MED ORDER — FLUTICASONE PROPIONATE 50 MCG/ACT NA SUSP: 2.0000 | Freq: Every day | NASAL | 6 refills | Status: DC

## 2021-04-16 MED ORDER — AMOXICILLIN-POT CLAVULANATE 875-125 MG PO TABS
1.0000 | ORAL_TABLET | Freq: Two times a day (BID) | ORAL | 0 refills | Status: DC
Start: 2021-04-16 — End: 2021-06-24

## 2021-04-16 NOTE — Progress Notes (Signed)

## 2021-04-16 NOTE — Progress Notes (Signed)
Virtual Visit Consent   COURT GRACIA, you are scheduled for a virtual visit with a [facility] provider today.     Just as with appointments in the office, your consent must be obtained to participate.  Your consent will be active for this visit and any virtual visit you may have with one of our providers in the next 365 days.     If you have a MyChart account, a copy of this consent can be sent to you electronically.  All virtual visits are billed to your insurance company just like a traditional visit in the office.    As this is a virtual visit, video technology does not allow for your provider to perform a traditional examination.  This may limit your provider's ability to fully assess your condition.  If your provider identifies any concerns that need to be evaluated in person or the need to arrange testing (such as labs, EKG, etc.), we will make arrangements to do so.     Although advances in technology are sophisticated, we cannot ensure that it will always work on either your end or our end.  If the connection with a video visit is poor, the visit may have to be switched to a telephone visit.  With either a video or telephone visit, we are not always able to ensure that we have a secure connection.     I need to obtain your verbal consent now.   Are you willing to proceed with your visit today?    GERHART RUGGIERI has provided verbal consent on 04/16/2021 for a virtual visit (video or telephone).   Margaretann Loveless, PA-C   Date: 04/16/2021 2:20 PM   Virtual Visit via Video Note   I, Margaretann Loveless, connected with  BENTLEY FISSEL  ([id number], 1975-05-15) on 04/16/21 at  2:15 PM EST by a video-enabled telemedicine application and verified that I am speaking with the correct person using two identifiers.  Location: Patient: Virtual Visit Location Patient: Home Provider: Virtual Visit Location Provider: Home Office   I discussed the limitations of evaluation and  management by telemedicine and the availability of in person appointments. The patient expressed understanding and agreed to proceed.    History of Present Illness: Jordan Sampson is a 46 y.o. who identifies as a male who was assigned male at birth, and is being seen today for possible sinus infection.  HPI: Sinusitis This is a new problem. The current episode started in the past 7 days. The problem has been gradually worsening since onset. There has been no fever. Associated symptoms include chills, congestion, coughing, ear pain (popping), headaches and sinus pressure. Pertinent negatives include no hoarse voice or sore throat. (Myalgias, post nasal drainage) Treatments tried: flonase. The treatment provided no relief.  Did e-visit earlier   Problems:  Patient Active Problem List   Diagnosis Date Noted   Inappropriate sinus tachycardia/POTS 01/23/2016   Anxiety associated with depression 01/23/2016   Elevated blood pressure 01/23/2016   Family history of premature CAD 04/12/2011   Smoking hx 04/12/2011    Allergies: No Known Allergies Medications:  Current Outpatient Medications:    amoxicillin-clavulanate (AUGMENTIN) 875-125 MG tablet, Take 1 tablet by mouth 2 (two) times daily., Disp: 14 tablet, Rfl: 0   atorvastatin (LIPITOR) 20 MG tablet, Take 1 tablet (20 mg total) by mouth every morning., Disp: 90 tablet, Rfl: 3   Azelastine HCl 0.15 % SOLN, Place 1 spray into both nostrils 2 (two) times  daily., Disp: , Rfl:    benzonatate (TESSALON PERLES) 100 MG capsule, Take 1 capsule (100 mg total) by mouth 3 (three) times daily as needed., Disp: 20 capsule, Rfl: 0   Dexlansoprazole (DEXILANT) 30 MG capsule, Take 1 capsule by mouth daily., Disp: , Rfl:    esomeprazole (NEXIUM) 20 MG capsule, Take 20 mg by mouth daily before breakfast., Disp: , Rfl:    fluticasone (FLONASE) 50 MCG/ACT nasal spray, Place 2 sprays into both nostrils daily., Disp: 16 g, Rfl: 6   loratadine (CLARITIN) 10 MG  tablet, Take 10 mg by mouth daily., Disp: , Rfl:    montelukast (SINGULAIR) 10 MG tablet, Take 1 tablet (10 mg total) by mouth at bedtime., Disp: 90 tablet, Rfl: 3   Multiple Vitamins-Minerals (MULTIVITAMIN ADULT PO), Take 1 tablet by mouth daily., Disp: , Rfl:    sertraline (ZOLOFT) 100 MG tablet, Take 2 tablets (200 mg total) by mouth daily., Disp: 180 tablet, Rfl: 3  Observations/Objective: Patient is well-developed, well-nourished in no acute distress.  Resting comfortably at home.  Head is normocephalic, atraumatic.  No labored breathing.  Speech is clear and coherent with logical content.  Patient is alert and oriented at baseline.  Nasal congestion heard  Assessment and Plan: 1. Acute bacterial sinusitis - amoxicillin-clavulanate (AUGMENTIN) 875-125 MG tablet; Take 1 tablet by mouth 2 (two) times daily.  Dispense: 14 tablet; Refill: 0  - Worsening symptoms that have not responded to OTC medications.  - Will give augmentin - Continue allergy medications.  - Stay well hydrated and get plenty of rest.  - Call PCP if no symptom improvement or if symptoms worsen.   Follow Up Instructions: I discussed the assessment and treatment plan with the patient. The patient was provided an opportunity to ask questions and all were answered. The patient agreed with the plan and demonstrated an understanding of the instructions.  A copy of instructions were sent to the patient via MyChart unless otherwise noted below.    The patient was advised to call back or seek an in-person evaluation if the symptoms worsen or if the condition fails to improve as anticipated.  Time:  I spent 8 minutes with the patient via telehealth technology discussing the above problems/concerns.    Margaretann Loveless, PA-C

## 2021-04-16 NOTE — Patient Instructions (Signed)
Jordan Sampson, thank you for joining Margaretann Loveless, PA-C for today's virtual visit.  While this provider is not your primary care provider (PCP), if your PCP is located in our provider database this encounter information will be shared with them immediately following your visit.  Consent: (Patient) Jordan Sampson provided verbal consent for this virtual visit at the beginning of the encounter.  Current Medications:  Current Outpatient Medications:    amoxicillin-clavulanate (AUGMENTIN) 875-125 MG tablet, Take 1 tablet by mouth 2 (two) times daily., Disp: 14 tablet, Rfl: 0   atorvastatin (LIPITOR) 20 MG tablet, Take 1 tablet (20 mg total) by mouth every morning., Disp: 90 tablet, Rfl: 3   Azelastine HCl 0.15 % SOLN, Place 1 spray into both nostrils 2 (two) times daily., Disp: , Rfl:    benzonatate (TESSALON PERLES) 100 MG capsule, Take 1 capsule (100 mg total) by mouth 3 (three) times daily as needed., Disp: 20 capsule, Rfl: 0   Dexlansoprazole (DEXILANT) 30 MG capsule, Take 1 capsule by mouth daily., Disp: , Rfl:    esomeprazole (NEXIUM) 20 MG capsule, Take 20 mg by mouth daily before breakfast., Disp: , Rfl:    fluticasone (FLONASE) 50 MCG/ACT nasal spray, Place 2 sprays into both nostrils daily., Disp: 16 g, Rfl: 6   loratadine (CLARITIN) 10 MG tablet, Take 10 mg by mouth daily., Disp: , Rfl:    montelukast (SINGULAIR) 10 MG tablet, Take 1 tablet (10 mg total) by mouth at bedtime., Disp: 90 tablet, Rfl: 3   Multiple Vitamins-Minerals (MULTIVITAMIN ADULT PO), Take 1 tablet by mouth daily., Disp: , Rfl:    sertraline (ZOLOFT) 100 MG tablet, Take 2 tablets (200 mg total) by mouth daily., Disp: 180 tablet, Rfl: 3   Medications ordered in this encounter:  Meds ordered this encounter  Medications   amoxicillin-clavulanate (AUGMENTIN) 875-125 MG tablet    Sig: Take 1 tablet by mouth 2 (two) times daily.    Dispense:  14 tablet    Refill:  0    Order Specific Question:   Supervising  Provider    Answer:   Hyacinth Meeker, BRIAN [[id number]]     *If you need refills on other medications prior to your next appointment, please contact your pharmacy*  Follow-Up: Call back or seek an in-person evaluation if the symptoms worsen or if the condition fails to improve as anticipated.  Other Instructions Sinusitis, Adult Sinusitis is soreness and swelling (inflammation) of your sinuses. Sinuses are hollow spaces in the bones around your face. They are located: Around your eyes. In the middle of your forehead. Behind your nose. In your cheekbones. Your sinuses and nasal passages are lined with a fluid called mucus. Mucus drains out of your sinuses. Swelling can trap mucus in your sinuses. This lets germs (bacteria, virus, or fungus) grow, which leads to infection. Most of the time, this condition is caused by a virus. What are the causes? This condition is caused by: Allergies. Asthma. Germs. Things that block your nose or sinuses. Growths in the nose (nasal polyps). Chemicals or irritants in the air. Fungus (rare). What increases the risk? You are more likely to develop this condition if: You have a weak body defense system (immune system). You do a lot of swimming or diving. You use nasal sprays too much. You smoke. What are the signs or symptoms? The main symptoms of this condition are pain and a feeling of pressure around the sinuses. Other symptoms include: Stuffy nose (congestion). Runny nose (drainage). Swelling  and warmth in the sinuses. Headache. Toothache. A cough that may get worse at night. Mucus that collects in the throat or the back of the nose (postnasal drip). Being unable to smell and taste. Being very tired (fatigue). A fever. Sore throat. Bad breath. How is this diagnosed? This condition is diagnosed based on: Your symptoms. Your medical history. A physical exam. Tests to find out if your condition is short-term (acute) or long-term (chronic). Your  doctor may: Check your nose for growths (polyps). Check your sinuses using a tool that has a light (endoscope). Check for allergies or germs. Do imaging tests, such as an MRI or CT scan. How is this treated? Treatment for this condition depends on the cause and whether it is short-term or long-term. If caused by a virus, your symptoms should go away on their own within 10 days. You may be given medicines to relieve symptoms. They include: Medicines that shrink swollen tissue in the nose. Medicines that treat allergies (antihistamines). A spray that treats swelling of the nostrils.  Rinses that help get rid of thick mucus in your nose (nasal saline washes). If caused by bacteria, your doctor may wait to see if you will get better without treatment. You may be given antibiotic medicine if you have: A very bad infection. A weak body defense system. If caused by growths in the nose, you may need to have surgery. Follow these instructions at home: Medicines Take, use, or apply over-the-counter and prescription medicines only as told by your doctor. These may include nasal sprays. If you were prescribed an antibiotic medicine, take it as told by your doctor. Do not stop taking the antibiotic even if you start to feel better. Hydrate and humidify  Drink enough water to keep your pee (urine) pale yellow. Use a cool mist humidifier to keep the humidity level in your home above 50%. Breathe in steam for 10-15 minutes, 3-4 times a day, or as told by your doctor. You can do this in the bathroom while a hot shower is running. Try not to spend time in cool or dry air. Rest Rest as much as you can. Sleep with your head raised (elevated). Make sure you get enough sleep each night. General instructions  Put a warm, moist washcloth on your face 3-4 times a day, or as often as told by your doctor. This will help with discomfort. Wash your hands often with soap and water. If there is no soap and water,  use hand sanitizer. Do not smoke. Avoid being around people who are smoking (secondhand smoke). Keep all follow-up visits as told by your doctor. This is important. Contact a doctor if: You have a fever. Your symptoms get worse. Your symptoms do not get better within 10 days. Get help right away if: You have a very bad headache. You cannot stop throwing up (vomiting). You have very bad pain or swelling around your face or eyes. You have trouble seeing. You feel confused. Your neck is stiff. You have trouble breathing. Summary Sinusitis is swelling of your sinuses. Sinuses are hollow spaces in the bones around your face. This condition is caused by tissues in your nose that become inflamed or swollen. This traps germs. These can lead to infection. If you were prescribed an antibiotic medicine, take it as told by your doctor. Do not stop taking it even if you start to feel better. Keep all follow-up visits as told by your doctor. This is important. This information is not intended  to replace advice given to you by your health care provider. Make sure you discuss any questions you have with your health care provider. Document Revised: 09/18/2017 Document Reviewed: 09/18/2017 Elsevier Patient Education  2022 [employer].    If you have been instructed to have an in-person evaluation today at a local Urgent Care facility, please use the link below. It will take you to a list of all of our available [facility], including address, phone number and hours of operation. Please do not delay care.  [facility]  If you or a family member do not have a primary care provider, use the link below to schedule a visit and establish care. When you choose a [facility] primary care physician or advanced practice provider, you gain a long-term partner in health. Find a Primary Care Provider  Learn more about [facility]'s in-office and virtual care options: [facility] - Get  Care Now

## 2021-06-14 ENCOUNTER — Other Ambulatory Visit: Payer: Self-pay

## 2021-06-14 DIAGNOSIS — F419 Anxiety disorder, unspecified: Secondary | ICD-10-CM

## 2021-06-14 MED ORDER — SERTRALINE HCL 100 MG PO TABS: 200.0000 mg | ORAL_TABLET | Freq: Every day | ORAL | 3 refills | Status: DC

## 2021-06-17 ENCOUNTER — Ambulatory Visit: Payer: Self-pay

## 2021-06-17 ENCOUNTER — Other Ambulatory Visit: Payer: Self-pay

## 2021-06-17 DIAGNOSIS — Z0110 Encounter for examination of ears and hearing without abnormal findings: Secondary | ICD-10-CM

## 2021-06-17 DIAGNOSIS — Z0000 Encounter for general adult medical examination without abnormal findings: Secondary | ICD-10-CM

## 2021-06-17 LAB — POCT URINALYSIS DIPSTICK
Bilirubin, UA: NEGATIVE
Blood, UA: NEGATIVE
Glucose, UA: NEGATIVE
Ketones, UA: NEGATIVE
Leukocytes, UA: NEGATIVE
Nitrite, UA: NEGATIVE
Protein, UA: NEGATIVE
Spec Grav, UA: 1.015 (ref 1.010–1.025)
Urobilinogen, UA: 0.2 E.U./dL
pH, UA: 7 (ref 5.0–8.0)

## 2021-06-17 NOTE — Progress Notes (Signed)
06/24/21 annual physical scheduled. °

## 2021-06-17 NOTE — Progress Notes (Signed)
30 day hearing retest scheduled for 07/09/2021 at 8:45 am.  AMD

## 2021-06-18 LAB — CMP12+LP+TP+TSH+6AC+PSA+CBC…
ALT: 39 IU/L (ref 0–44)
AST: 28 IU/L (ref 0–40)
Albumin/Globulin Ratio: 2.5 — ABNORMAL HIGH (ref 1.2–2.2)
Albumin: 5.3 g/dL — ABNORMAL HIGH (ref 4.0–5.0)
Alkaline Phosphatase: 99 IU/L (ref 44–121)
BUN/Creatinine Ratio: 12 (ref 9–20)
BUN: 11 mg/dL (ref 6–24)
Basophils Absolute: 0.1 10*3/uL (ref 0.0–0.2)
Basos: 1 %
Bilirubin Total: 1.2 mg/dL (ref 0.0–1.2)
Calcium: 9.8 mg/dL (ref 8.7–10.2)
Chloride: 103 mmol/L (ref 96–106)
Chol/HDL Ratio: 4.3 ratio (ref 0.0–5.0)
Cholesterol, Total: 162 mg/dL (ref 100–199)
Creatinine, Ser: 0.91 mg/dL (ref 0.76–1.27)
EOS (ABSOLUTE): 0.1 10*3/uL (ref 0.0–0.4)
Eos: 2 %
Estimated CHD Risk: 0.8 times avg. (ref 0.0–1.0)
Free Thyroxine Index: 1.6 (ref 1.2–4.9)
GGT: 24 IU/L (ref 0–65)
Globulin, Total: 2.1 g/dL (ref 1.5–4.5)
Glucose: 106 mg/dL — ABNORMAL HIGH (ref 70–99)
HDL: 38 mg/dL — ABNORMAL LOW (ref >39)
Hematocrit: 42.5 % (ref 37.5–51.0)
Hemoglobin: 14.6 g/dL (ref 13.0–17.7)
Immature Grans (Abs): 0 10*3/uL (ref 0.0–0.1)
Immature Granulocytes: 0 %
Iron: 135 ug/dL (ref 38–169)
LDH: 138 IU/L (ref 121–224)
LDL Chol Calc (NIH): 100 mg/dL — ABNORMAL HIGH (ref 0–99)
Lymphocytes Absolute: 2.3 10*3/uL (ref 0.7–3.1)
Lymphs: 33 %
MCH: 29.6 pg (ref 26.6–33.0)
MCHC: 34.4 g/dL (ref 31.5–35.7)
MCV: 86 fL (ref 79–97)
Monocytes Absolute: 0.6 10*3/uL (ref 0.1–0.9)
Monocytes: 8 %
Neutrophils Absolute: 3.9 10*3/uL (ref 1.4–7.0)
Neutrophils: 56 %
Phosphorus: 2.9 mg/dL (ref 2.8–4.1)
Platelets: 296 10*3/uL (ref 150–450)
Potassium: 4.4 mmol/L (ref 3.5–5.2)
Prostate Specific Ag, Serum: 0.5 ng/mL (ref 0.0–4.0)
RBC: 4.94 x10E6/uL (ref 4.14–5.80)
RDW: 13.1 % (ref 11.6–15.4)
Sodium: 144 mmol/L (ref 134–144)
T3 Uptake Ratio: 22 % — ABNORMAL LOW (ref 24–39)
T4, Total: 7.4 ug/dL (ref 4.5–12.0)
TSH: 0.983 u[IU]/mL (ref 0.450–4.500)
Total Protein: 7.4 g/dL (ref 6.0–8.5)
Triglycerides: 134 mg/dL (ref 0–149)
Uric Acid: 4.9 mg/dL (ref 3.8–8.4)
VLDL Cholesterol Cal: 24 mg/dL (ref 5–40)
WBC: 7 10*3/uL (ref 3.4–10.8)
eGFR: 105 mL/min/{1.73_m2} (ref >59)

## 2021-06-24 ENCOUNTER — Ambulatory Visit: Payer: Self-pay | Admitting: Physician Assistant

## 2021-06-24 ENCOUNTER — Other Ambulatory Visit: Payer: Self-pay

## 2021-06-24 ENCOUNTER — Encounter: Payer: Self-pay | Admitting: Physician Assistant

## 2021-06-24 VITALS — BP 132/76 | HR 70 | Temp 98.2°F | Resp 14 | Ht 76.0 in | Wt 215.0 lb

## 2021-06-24 DIAGNOSIS — Z0000 Encounter for general adult medical examination without abnormal findings: Secondary | ICD-10-CM

## 2021-06-24 NOTE — Progress Notes (Signed)
[facility] [place] [facility]  ____________________________________________   None    (approximate)  I have reviewed the triage vital signs and the nursing notes.   HISTORY  Chief Complaint No chief complaint on file.    HPI Jordan Sampson is a 46 y.o. male patient presents for annual physical exam.  Patient with no concerns or complaints.  Patient past medical history remarkable for hyperlipidemia and seasonal rhinitis.         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 Authorizing Provider  atorvastatin (LIPITOR) 20 MG tablet Take 1 tablet (20 mg total) by mouth every morning. 12/29/20  Yes Sable Feil, PA-C  Azelastine HCl 0.15 % SOLN Place 1 spray into both nostrils 2 (two) times daily. 12/10/18  Yes [provider]  esomeprazole (NEXIUM) 20 MG capsule Take 20 mg by mouth daily before breakfast.   Yes [provider]  fluticasone (FLONASE) 50 MCG/ACT nasal spray Place 2 sprays into both nostrils daily. 04/16/21  Yes Hawks, Christy A, FNP  Multiple Vitamins-Minerals (MULTIVITAMIN ADULT PO) Take 1 tablet by mouth daily.   Yes [provider]  sertraline (ZOLOFT) 100 MG tablet Take 2 tablets (200 mg total) by mouth daily. 06/14/21  Yes Sable Feil, PA-C    Allergies Patient has no known allergies.  Family History  Problem Relation Age of Onset   Coronary artery disease Mother        less than 26 yo   Other Mother  78       triple bypass surgery   Heart disease Mother     Social History Social History   Tobacco Use   Smoking status: Former    Packs/day: 1.00    Years: 4.00    Pack years: 4.00    Types: Cigarettes    Quit date: 04/12/1999    Years since quitting: 22.2   Smokeless tobacco: Current    Types: Snuff   Tobacco comments:    1 Can daily  Substance Use Topics   Alcohol use: No   Drug use: No    Review of Systems Constitutional: No fever/chills Eyes: No visual changes. ENT: No sore throat. Cardiovascular: Denies chest pain. Respiratory: Denies shortness of breath. Gastrointestinal: No abdominal pain.  No nausea, no vomiting.  No diarrhea.  No constipation. Genitourinary: Negative for dysuria. Musculoskeletal: Negative for back pain. Skin: Negative for rash. Neurological: Negative for headaches, focal weakness or numbness. Endocrine: Hyperlipidemia ____________________________________________   PHYSICAL EXAM:  VITAL SIGNS: Temperature 98.2, pulse 70, respiration 14, BP is 132/76, and patient 96% O2 sat on room air.  Patient weighs 215 pounds and BMI is 26.17. Constitutional: Alert and oriented. Well appearing and in no acute distress. Eyes: Conjunctivae are normal. PERRL. EOMI. Head: Atraumatic. Nose: No congestion/rhinnorhea. Mouth/Throat: Mucous membranes are moist.  Oropharynx non-erythematous. Neck: No stridor.  No cervical spine tenderness to palpation. Hematological/Lymphatic/Immunilogical: No cervical lymphadenopathy. Cardiovascular: Normal rate, regular rhythm. Grossly normal heart sounds.  Good peripheral circulation.  Respiratory: Normal respiratory effort.  No retractions. Lungs CTAB. Gastrointestinal: Soft and nontender. No distention. No abdominal bruits. No CVA tenderness. Genitourinary: Deferred Musculoskeletal: No lower extremity tenderness nor edema.  No joint effusions. Neurologic:  Normal speech and language. No gross focal neurologic deficits are  appreciated. No gait instability. Skin:  Skin is warm, dry and intact. No rash noted. Psychiatric: Mood and affect are normal. Speech and behavior are normal.  ____________________________________________   LABS  __       Component Ref Range & Units 7 d ago 1 yr ago 2 yr ago  Color, UA  yellow  Yellow  yellow   Clarity, UA  clear  Clear  clear   Glucose, UA Negative Negative  Negative  Negative   Bilirubin, UA  negative  Negative  negative   Ketones, UA  negative  Negative  negative   Spec Grav, UA 1.010 - 1.025 1.015  1.015  1.015   Blood, UA  negative  Negative  negative   pH, UA 5.0 - 8.0 7.0  8.0  7.5   Protein, UA Negative Negative  Negative  Negative   Urobilinogen, UA 0.2 or 1.0 E.U./dL 0.2  0.2  0.2   Nitrite, UA  negative  Negative  negative   Leukocytes, UA Negative Negative  Negative  Negative   Appearance  medium       Odor               Specimen Collected: 06/17/21 09:01 Last Resulted: 06/17/21 09:01      Lab Flowsheet    Order Details    View Encounter    Lab and Collection Details    Routing    Result History    View Encounter Conversation        Result Care Coordination   Patient Communication   Add Comments   Add Notifications  Back to Top       Other Results from 06/17/2021   Contains abnormal data CMP12+LP+TP+TSH+6AC+PSA+CBC Order: [id number] Status: Final result    Visible to patient: Yes (not seen)    Next appt: 07/09/2021 at 08:45 AM in [facility] ([facility])    Dx: Routine adult health maintenance    0 Result Notes            Component Ref Range & Units 7 d ago (06/17/21) 1 yr ago (05/15/20) 2 yr ago (06/05/19) 9 yr ago (05/22/12) 9 yr ago (05/08/12) 9 yr ago (05/08/12) 9 yr ago (05/01/12) 9 yr ago (05/01/12)  Glucose 70 - 99 mg/dL 106 High   96 R  95 R   101 High  R   92 R    Uric Acid 3.8 - 8.4 mg/dL 4.9  4.6 CM  4.5 CM        Comment:            Therapeutic target for gout patients: <6.0  BUN 6 - 24 mg/dL 11  11  10   13  R   9 R     Creatinine, Ser 0.76 - 1.27 mg/dL 0.91  0.79  0.93  0.95 R  0.97 R   0.71 R    eGFR >59 mL/min/1.73 105          BUN/Creatinine Ratio 9 - 20 12  14  11         Sodium 134 - 144 mmol/L 144  138  139   140 R   141 R    Potassium 3.5 - 5.2  mmol/L 4.4  4.1  4.8   4.6 R   4.0 R    Chloride 96 - 106 mmol/L 103  101  102   102 R   105 R    Calcium 8.7 - 10.2 mg/dL 9.8  9.4  9.3   9.8 R   8.8 R    Phosphorus 2.8 - 4.1 mg/dL 2.9  2.5 Low   2.5 Low         Total Protein 6.0 - 8.5 g/dL 7.4  7.2  7.2   8.3 High  R   8.1 R    Albumin 4.0 - 5.0 g/dL 5.3 High   4.8  4.8   4.8 R   4.5 R    Globulin, Total 1.5 - 4.5 g/dL 2.1  2.4  2.4        Albumin/Globulin Ratio 1.2 - 2.2 2.5 High   2.0  2.0        Bilirubin Total 0.0 - 1.2 mg/dL 1.2  1.2  1.3 High    1.7 High  R   1.6 High  R    Alkaline Phosphatase 44 - 121 IU/L 99  92 CM  99 R   92 R   87 R    LDH 121 - 224 IU/L 138  136  190        AST 0 - 40 IU/L 28  23  30   20  R   22 R    ALT 0 - 44 IU/L 39  31  37   31 R   30 R    GGT 0 - 65 IU/L 24  24  24         Iron 38 - 169 ug/dL 135  123  140        Cholesterol, Total 100 - 199 mg/dL 162  155  154        Triglycerides 0 - 149 mg/dL 134  113  136        HDL >39 mg/dL 38 Low   38 Low   36 Low         VLDL Cholesterol Cal 5 - 40 mg/dL 24  21  24         LDL Chol Calc (NIH) 0 - 99 mg/dL 100 High   96  94        Chol/HDL Ratio 0.0 - 5.0 ratio 4.3  4.1 CM  4.3 CM        Comment:                                   T. Chol/HDL Ratio                                              Men  Women                                1/2 Avg.Risk  3.4    3.3                                    Avg.Risk  5.0    4.4  2X Avg.Risk  9.6    7.1                                 3X Avg.Risk 23.4   11.0   Estimated CHD Risk 0.0 - 1.0 times avg. 0.8  0.8 CM  0.8 CM        Comment: The CHD Risk is based on the T. Chol/HDL ratio. Other  factors affect CHD Risk such as hypertension, smoking,  diabetes,  severe obesity, and family history of  premature CHD.   TSH 0.450 - 4.500 uIU/mL 0.983  1.230  1.180        T4, Total 4.5 - 12.0 ug/dL 7.4  6.9  6.5        T3 Uptake Ratio 24 - 39 % 22 Low   24  23 Low         Free Thyroxine Index 1.2 - 4.9 1.6  1.7  1.5        Prostate Specific Ag, Serum 0.0 - 4.0 ng/mL 0.5  0.4 CM  0.4 CM        Comment: Roche ECLIA methodology.  According to the American Urological Association, Serum PSA should  decrease and remain at undetectable levels after radical  prostatectomy. The AUA defines biochemical recurrence as an initial  PSA value 0.2 ng/mL or greater followed by a subsequent confirmatory  PSA value 0.2 ng/mL or greater.  Values obtained with different assay methods or kits cannot be used  interchangeably. Results cannot be interpreted as absolute evidence  of the presence or absence of malignant disease.   WBC 3.4 - 10.8 x10E3/uL 7.0  7.4  6.7    9.0 R   8.9 R   RBC 4.14 - 5.80 x10E6/uL 4.94  4.78  4.88    4.90 R   4.76 R   Hemoglobin 13.0 - 17.7 g/dL 14.6  14.2  14.6        Hematocrit 37.5 - 51.0 % 42.5  42.0  43.0        MCV 79 - 97 fL 86  88  88    89 R   89 R   MCH 26.6 - 33.0 pg 29.6  29.7  29.9    29.3 R   30.7 R   MCHC 31.5 - 35.7 g/dL 34.4  33.8  34.0    32.9 R   34.5 R   RDW 11.6 - 15.4 % 13.1  13.3  13.1    13.7 R   14.0 R   Platelets 150 - 450 x10E3/uL 296  282  304    354 R   320 R   Neutrophils Not Estab. % 56  57  54    60.9 R     Lymphs Not Estab. % 33  32  34        Monocytes Not Estab. % 8  8  9         Eos Not Estab. % 2  2  2         Basos Not Estab. % 1  1  1         Neutrophils Absolute 1.4 - 7.0 x10E3/uL 3.9  4.2  3.7    5.5 R     Lymphocytes Absolute 0.7 - 3.1 x10E3/uL 2.3  2.4  2.3    2.7 R     Monocytes Absolute 0.1 - 0.9 x10E3/uL 0.6  0.6  0.6        EOS (ABSOLUTE) 0.0 - 0.4 x10E3/uL 0.1  0.2  0.1        Basophils Absolute 0.0 - 0.2 x10E3/uL 0.1  0.1  0.1    0.1 R     Immature Granulocytes Not Estab. % 0  0  0         Immature Grans              __________________________________________  EKG Sinus  Bradycardia at 57 bpm. WITHIN NORMAL LIMITS  ____________________________________________   ____________________________________________   INITIAL IMPRESSION / ASSESSMENT  As part of my medical decision making, I reviewed the following data within the [place]      Discussed lab results and EKG with patient.        ____________________________________________   FINAL CLINICAL IMPRESSION Well exam ED Discharge Orders     None        Note:  This document was prepared using Dragon voice recognition software and may include unintentional dictation errors.

## 2021-06-24 NOTE — Progress Notes (Signed)
Pt denies any issues or concerns at this time/CL,RMA ?

## 2021-07-09 ENCOUNTER — Other Ambulatory Visit: Payer: Self-pay

## 2021-07-09 ENCOUNTER — Ambulatory Visit: Payer: Self-pay

## 2021-07-09 DIAGNOSIS — Z0110 Encounter for examination of ears and hearing without abnormal findings: Secondary | ICD-10-CM

## 2021-07-09 NOTE — Progress Notes (Signed)
Annual Hearing Screen 06/17/2021 - Possible STS Left Ear ? ?30 Day Hearing Retest Confirmed possible STS in left ear ? ?Hearing tests & Extended Hearing Questionnaire will be sent to ?Dr. Odis Luster - [facility] Medical Director ? ?AMD ?

## 2021-08-13 ENCOUNTER — Telehealth: Payer: 59 | Admitting: Physician Assistant

## 2021-08-13 DIAGNOSIS — J019 Acute sinusitis, unspecified: Secondary | ICD-10-CM

## 2021-08-13 DIAGNOSIS — B9689 Other specified bacterial agents as the cause of diseases classified elsewhere: Secondary | ICD-10-CM | POA: Diagnosis not present

## 2021-08-13 MED ORDER — AMOXICILLIN-POT CLAVULANATE 875-125 MG PO TABS
1.0000 | ORAL_TABLET | Freq: Two times a day (BID) | ORAL | 0 refills | Status: DC
Start: 2021-08-13 — End: 2021-09-14

## 2021-08-13 MED ORDER — FLUTICASONE PROPIONATE 50 MCG/ACT NA SUSP: 2.0000 | Freq: Every day | NASAL | 6 refills | Status: AC

## 2021-08-13 NOTE — Progress Notes (Signed)

## 2021-08-16 ENCOUNTER — Other Ambulatory Visit: Payer: Self-pay | Admitting: Physician Assistant

## 2021-08-16 ENCOUNTER — Other Ambulatory Visit: Payer: Self-pay

## 2021-08-16 DIAGNOSIS — E785 Hyperlipidemia, unspecified: Secondary | ICD-10-CM

## 2021-08-16 MED ORDER — ATORVASTATIN CALCIUM 40 MG PO TABS: 40.0000 mg | ORAL_TABLET | Freq: Every day | ORAL | 3 refills | Status: DC

## 2021-09-14 ENCOUNTER — Encounter: Payer: Self-pay | Admitting: Physician Assistant

## 2021-09-14 ENCOUNTER — Ambulatory Visit: Payer: Self-pay | Admitting: Physician Assistant

## 2021-09-14 VITALS — BP 128/87 | HR 67 | Temp 97.8°F | Resp 14 | Ht 76.0 in | Wt 210.0 lb

## 2021-09-14 DIAGNOSIS — F4323 Adjustment disorder with mixed anxiety and depressed mood: Secondary | ICD-10-CM

## 2021-09-14 MED ORDER — SERTRALINE HCL 25 MG PO TABS: 25.0000 mg | ORAL_TABLET | Freq: Two times a day (BID) | ORAL | 0 refills | Status: DC

## 2021-09-14 NOTE — Progress Notes (Signed)
? ?  Subjective:Anxiety/ Depression  ? ? Patient ID: Jordan Sampson, male    DOB: 06/29/1975, 46 y.o.   MRN: [id number] ? ?HPI ?Patient states that he believes his antidepressant medicine not working as effective as in the past.  Patient has been on Zoloft since 2014.  Patient is currently at a dose of 200 mg daily.  Patient stated the patient has been increased anxiety and depression secondary to family issues.  Patient states he is having increased fatigue throughout the day.  Patient states he routinely goes to bed about 9 to 9:30 PM and wakes with 5:30 PM due to have to be at work at 6:00.  Reviewed patient CMP which was performed on 06/17/2021 with no acute findings. ? ? ?Review of Systems ?Anxiety associated with depression and hypertension. ?   ?Objective:  ? Physical Exam ?BP is 128/87, pulse 67, respiration 14, temperature 97.8, and patient 95% O2 sat on room air.  Patient weighs 210 pounds and BMI is 25.56. ?HEENT is unremarkable.  Neck is supple lymphadenopathy or bruits.  Lungs are clear to auscultation.  Heart regular rate and rhythm.  Abdomen distended secondary to body habitus, normoactive bowel sounds, soft, nontender to palpation.  No obvious deformity to the upper or lower extremities.  Patient has full and equal range of motion of the upper and lower extremities.  No obvious cervical or lumbar spine deformity.  Patient has full and equal range of motion of the cervical lumbar spine. ? ? ? ? ?   ?Assessment & Plan: Anxiety with depression  ? ?We will increase patient Zoloft to 125 mg twice daily for 2 weeks.  Patient will follow-up in 2 weeks. ?

## 2021-09-14 NOTE — Progress Notes (Signed)
Pt presents today to complete physical. Pt wants to talk about his medications.  ?

## 2021-09-22 ENCOUNTER — Other Ambulatory Visit: Payer: Self-pay

## 2021-09-22 DIAGNOSIS — Z8639 Personal history of other endocrine, nutritional and metabolic disease: Secondary | ICD-10-CM

## 2021-09-22 NOTE — Progress Notes (Signed)
Pt presents today for 3 month lipid

## 2021-09-23 LAB — LIPID PANEL
Chol/HDL Ratio: 3.9 ratio (ref 0.0–5.0)
Cholesterol, Total: 137 mg/dL (ref 100–199)
HDL: 35 mg/dL — ABNORMAL LOW (ref >39)
LDL Chol Calc (NIH): 79 mg/dL (ref 0–99)
Triglycerides: 130 mg/dL (ref 0–149)
VLDL Cholesterol Cal: 23 mg/dL (ref 5–40)

## 2021-09-28 ENCOUNTER — Encounter: Payer: Self-pay | Admitting: Physician Assistant

## 2021-09-28 ENCOUNTER — Ambulatory Visit: Payer: Self-pay | Admitting: Physician Assistant

## 2021-09-28 DIAGNOSIS — F4323 Adjustment disorder with mixed anxiety and depressed mood: Secondary | ICD-10-CM

## 2021-09-28 DIAGNOSIS — E782 Mixed hyperlipidemia: Secondary | ICD-10-CM

## 2021-09-28 MED ORDER — SERTRALINE HCL 50 MG PO TABS: 50.0000 mg | ORAL_TABLET | Freq: Every day | ORAL | 3 refills | Status: DC

## 2021-09-28 NOTE — Progress Notes (Signed)
   Subjective: Hyperlipidemia anxiety    Patient ID: Jordan Sampson, male    DOB: [birth year], 46 y.o.   MRN: [id number]  HPI Patient here today to discuss lab results for hyperlipidemia.  Patient started Lipitor 40 mg on 08/16/2021.  Patient also had an increase of his Zoloft from 100 mg to 125 mg.  Patient states no side effects from medications.   Review of Systems     Objective:   Physical Exam        Assessment & Plan: Anxiety and hyperlipidemia.  Patient will now increase Zoloft to 150 mg twice daily.  Follow-up in 1 month.  Patient will continue Lipitor and follow-up in 6 months.

## 2021-09-28 NOTE — Progress Notes (Signed)
Pt presents today for lipid follow up.

## 2021-10-14 ENCOUNTER — Telehealth: Payer: 59 | Admitting: Family Medicine

## 2021-10-14 DIAGNOSIS — B9689 Other specified bacterial agents as the cause of diseases classified elsewhere: Secondary | ICD-10-CM

## 2021-10-14 DIAGNOSIS — J019 Acute sinusitis, unspecified: Secondary | ICD-10-CM | POA: Diagnosis not present

## 2021-10-14 MED ORDER — AMOXICILLIN-POT CLAVULANATE 875-125 MG PO TABS: 1.0000 | ORAL_TABLET | Freq: Two times a day (BID) | ORAL | 0 refills | Status: AC

## 2021-10-14 NOTE — Progress Notes (Signed)

## 2022-02-14 ENCOUNTER — Other Ambulatory Visit: Payer: Self-pay | Admitting: Physician Assistant

## 2022-02-14 MED ORDER — SERTRALINE HCL 50 MG PO TABS: 50.0000 mg | ORAL_TABLET | Freq: Every day | ORAL | 3 refills | Status: DC

## 2022-02-14 MED ORDER — SERTRALINE HCL 100 MG PO TABS: 200.0000 mg | ORAL_TABLET | Freq: Every day | ORAL | 3 refills | Status: DC

## 2022-02-14 MED ORDER — SERTRALINE HCL 100 MG PO TABS: 100.0000 mg | ORAL_TABLET | Freq: Two times a day (BID) | ORAL | 3 refills | Status: DC

## 2022-04-14 ENCOUNTER — Other Ambulatory Visit: Payer: Self-pay

## 2022-04-14 ENCOUNTER — Other Ambulatory Visit: Payer: Self-pay | Admitting: Physician Assistant

## 2022-04-14 DIAGNOSIS — F418 Other specified anxiety disorders: Secondary | ICD-10-CM

## 2022-04-14 MED ORDER — SERTRALINE HCL 100 MG PO TABS: 300.0000 mg | ORAL_TABLET | Freq: Every day | ORAL | 3 refills | Status: DC

## 2022-06-01 ENCOUNTER — Other Ambulatory Visit: Payer: Self-pay

## 2022-06-10 ENCOUNTER — Ambulatory Visit: Payer: Self-pay

## 2022-06-10 DIAGNOSIS — Z0000 Encounter for general adult medical examination without abnormal findings: Secondary | ICD-10-CM

## 2022-06-10 LAB — POCT URINALYSIS DIPSTICK
Bilirubin, UA: NEGATIVE
Blood, UA: NEGATIVE
Glucose, UA: NEGATIVE
Ketones, UA: NEGATIVE
Leukocytes, UA: NEGATIVE
Nitrite, UA: NEGATIVE
Protein, UA: POSITIVE — AB
Spec Grav, UA: 1.02 (ref 1.010–1.025)
Urobilinogen, UA: 0.2 E.U./dL
pH, UA: 6.5 (ref 5.0–8.0)

## 2022-06-10 NOTE — Progress Notes (Signed)
Pt presents today to complete lab portion of sceduled physical. Jordan Sampson

## 2022-06-11 LAB — CMP12+LP+TP+TSH+6AC+PSA+CBC…
ALT: 49 IU/L — ABNORMAL HIGH (ref 0–44)
AST: 29 IU/L (ref 0–40)
Albumin/Globulin Ratio: 2.5 — ABNORMAL HIGH (ref 1.2–2.2)
Albumin: 4.9 g/dL (ref 4.1–5.1)
Alkaline Phosphatase: 98 IU/L (ref 44–121)
BUN/Creatinine Ratio: 13 (ref 9–20)
BUN: 12 mg/dL (ref 6–24)
Basophils Absolute: 0.1 10*3/uL (ref 0.0–0.2)
Basos: 1 %
Bilirubin Total: 1.2 mg/dL (ref 0.0–1.2)
Calcium: 9.6 mg/dL (ref 8.7–10.2)
Chloride: 103 mmol/L (ref 96–106)
Chol/HDL Ratio: 3.6 ratio (ref 0.0–5.0)
Cholesterol, Total: 142 mg/dL (ref 100–199)
Creatinine, Ser: 0.95 mg/dL (ref 0.76–1.27)
EOS (ABSOLUTE): 0.2 10*3/uL (ref 0.0–0.4)
Eos: 2 %
Estimated CHD Risk: 0.6 times avg. (ref 0.0–1.0)
Free Thyroxine Index: 1.7 (ref 1.2–4.9)
GGT: 22 IU/L (ref 0–65)
Globulin, Total: 2 g/dL (ref 1.5–4.5)
Glucose: 101 mg/dL — ABNORMAL HIGH (ref 70–99)
HDL: 39 mg/dL — ABNORMAL LOW (ref >39)
Hematocrit: 40.9 % (ref 37.5–51.0)
Hemoglobin: 14.2 g/dL (ref 13.0–17.7)
Immature Grans (Abs): 0 10*3/uL (ref 0.0–0.1)
Immature Granulocytes: 0 %
Iron: 127 ug/dL (ref 38–169)
LDH: 141 IU/L (ref 121–224)
LDL Chol Calc (NIH): 83 mg/dL (ref 0–99)
Lymphocytes Absolute: 2.3 10*3/uL (ref 0.7–3.1)
Lymphs: 35 %
MCH: 30 pg (ref 26.6–33.0)
MCHC: 34.7 g/dL (ref 31.5–35.7)
MCV: 86 fL (ref 79–97)
Monocytes Absolute: 0.5 10*3/uL (ref 0.1–0.9)
Monocytes: 8 %
Neutrophils Absolute: 3.6 10*3/uL (ref 1.4–7.0)
Neutrophils: 54 %
Phosphorus: 2.4 mg/dL — ABNORMAL LOW (ref 2.8–4.1)
Platelets: 320 10*3/uL (ref 150–450)
Potassium: 4.3 mmol/L (ref 3.5–5.2)
Prostate Specific Ag, Serum: 0.5 ng/mL (ref 0.0–4.0)
RBC: 4.74 x10E6/uL (ref 4.14–5.80)
RDW: 13.2 % (ref 11.6–15.4)
Sodium: 142 mmol/L (ref 134–144)
T3 Uptake Ratio: 25 % (ref 24–39)
T4, Total: 6.6 ug/dL (ref 4.5–12.0)
TSH: 1.05 u[IU]/mL (ref 0.450–4.500)
Total Protein: 6.9 g/dL (ref 6.0–8.5)
Triglycerides: 107 mg/dL (ref 0–149)
Uric Acid: 4.6 mg/dL (ref 3.8–8.4)
VLDL Cholesterol Cal: 20 mg/dL (ref 5–40)
WBC: 6.7 10*3/uL (ref 3.4–10.8)
eGFR: 99 mL/min/{1.73_m2} (ref >59)

## 2022-06-13 ENCOUNTER — Encounter: Payer: Self-pay | Admitting: Physician Assistant

## 2022-06-13 ENCOUNTER — Ambulatory Visit: Payer: Self-pay | Admitting: Physician Assistant

## 2022-06-13 VITALS — BP 125/75 | HR 69 | Temp 98.4°F | Resp 14 | Ht 76.0 in | Wt 210.0 lb

## 2022-06-13 DIAGNOSIS — Z0000 Encounter for general adult medical examination without abnormal findings: Secondary | ICD-10-CM

## 2022-06-13 MED ORDER — EPINEPHRINE 0.3 MG/0.3ML IJ SOAJ: 0.3000 mg | INTRAMUSCULAR | 1 refills | Status: AC | PRN

## 2022-06-13 NOTE — Progress Notes (Signed)
[facility] ____________________________________________   None    (approximate)  I have reviewed the triage vital signs and the nursing notes.   HISTORY  Chief Complaint No chief complaint on file.   HPI Jordan Sampson is a 47 y.o. male patient presents for annual physical exam.  Patient close no concerning complaints.         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 Authorizing Provider  atorvastatin (LIPITOR) 40 MG tablet Take 1 tablet (40 mg total) by mouth daily. 08/16/21   Sable Feil, PA-C  Azelastine HCl 0.15 % SOLN U 1 SPRAY IEN BID    [provider]  Dexlansoprazole (DEXILANT) 30 MG capsule DR TK ONE C PO QAM    [provider]  esomeprazole (NEXIUM) 20 MG capsule Take 20 mg by mouth daily before breakfast.    [provider]  fluticasone (FLONASE) 50 MCG/ACT nasal spray Place 2 sprays into both nostrils daily. 08/13/21   Mar Daring, PA-C  montelukast (SINGULAIR) 10 MG tablet TK 1 T PO QPM    [provider]  sertraline (ZOLOFT) 100 MG tablet Take 3 tablets (300 mg total) by mouth daily. 04/14/22   Sable Feil, PA-C    Allergies Patient  has no known allergies.  Family History  Problem Relation Age of Onset   Coronary artery disease Mother        less than 91 yo   Other Mother 22       triple bypass surgery   Heart disease Mother     Social History Social History   Tobacco Use   Smoking status: Former    Packs/day: 1.00    Years: 4.00    Total pack years: 4.00    Types: Cigarettes    Quit date: 04/12/1999    Years since quitting: 23.1   Smokeless tobacco: Current    Types: Snuff   Tobacco comments:    1 Can daily  Substance Use Topics   Alcohol use: No   Drug use: No    Review of Systems Constitutional: No fever/chills Eyes: No visual changes. ENT: No sore throat. Cardiovascular: Denies chest pain. Respiratory: Denies shortness of breath. Gastrointestinal: No abdominal pain.  No nausea, no vomiting.  No diarrhea.  No constipation. Genitourinary: Negative for dysuria. Musculoskeletal: Negative for back pain. Skin: Negative for rash. Neurological: Negative for headaches, focal weakness or numbness. Psychiatric: Anxiety and depression Endocrine: Hyperlipidemia  ____________________________________________   PHYSICAL EXAM:  VITAL SIGNS: BP is 125/75, pulse 69, respiration 14, temperature 90.4, patient 95% O2 sat on room air.  Patient weighs 210 pounds BMI is 25.56. Constitutional: Alert and oriented. Well appearing and in no acute distress. Eyes:  Conjunctivae are normal. PERRL. EOMI. Head: Atraumatic. Nose: No congestion/rhinnorhea. Mouth/Throat: Mucous membranes are moist.  Oropharynx non-erythematous. Neck: No stridor.  No cervical spine tenderness to palpation. Hematological/Lymphatic/Immunilogical: No cervical lymphadenopathy. Cardiovascular: Normal rate, regular rhythm. Grossly normal heart sounds.  Good peripheral circulation. Respiratory: Normal respiratory effort.  No retractions. Lungs CTAB. Gastrointestinal: Soft and nontender. No distention. No abdominal bruits. No CVA  tenderness. Genitourinary: Deferred Musculoskeletal: No lower extremity tenderness nor edema.  No joint effusions. Neurologic:  Normal speech and language. No gross focal neurologic deficits are appreciated. No gait instability. Skin:  Skin is warm, dry and intact. No rash noted. Psychiatric: Mood and affect are normal. Speech and behavior are normal.  ____________________________________________   LABS         Component Ref Range & Units 3 d ago 12 mo ago 2 yr ago 3 yr ago  Color, UA  Dark Yellow yellow Yellow yellow  Clarity, UA  Clear clear Clear clear  Glucose, UA Negative Negative Negative Negative Negative  Bilirubin, UA  Negative negative Negative negative  Ketones, UA  Negative negative Negative negative  Spec Grav, UA 1.010 - 1.025 1.020 1.015 1.015 1.015  Blood, UA  Negative negative Negative negative  pH, UA 5.0 - 8.0 6.5 7.0 8.0 7.5  Protein, UA Negative Positive Abnormal  Negative Negative Negative  Comment: 1+  Urobilinogen, UA 0.2 or 1.0 E.U./dL 0.2 0.2 0.2 0.2  Nitrite, UA  Negative negative Negative negative  Leukocytes, UA Negative Negative Negative Negative Negative  Appearance   medium    Odor                       Component Ref Range & Units 3 d ago (06/10/22) 8 mo ago (09/22/21) 12 mo ago (06/17/21) 2 yr ago (05/15/20) 3 yr ago (06/05/19) 10 yr ago (05/22/12) 10 yr ago (05/08/12) 10 yr ago (05/08/12)  Glucose 70 - 99 mg/dL 101 High   106 High  96 R 95 R  101 High  R   Uric Acid 3.8 - 8.4 mg/dL 4.6  4.9 CM 4.6 CM 4.5 CM     Comment:            Therapeutic target for gout patients: <6.0  BUN 6 - 24 mg/dL 12  11 11 10  13 $ R   Creatinine, Ser 0.76 - 1.27 mg/dL 0.95  0.91 0.79 0.93 0.95 R 0.97 R   eGFR >59 mL/min/1.73 99  105       BUN/Creatinine Ratio 9 - 20 13  12 14 11     $ Sodium 134 - 144 mmol/L 142  144 138 139  140 R   Potassium 3.5 - 5.2 mmol/L 4.3  4.4 4.1 4.8  4.6 R   Chloride 96 - 106 mmol/L 103  103 101 102  102 R   Calcium 8.7 - 10.2 mg/dL 9.6  9.8  9.4 9.3  9.8 R   Phosphorus 2.8 - 4.1 mg/dL 2.4 Low   2.9 2.5 Low  2.5 Low      Total Protein 6.0 - 8.5 g/dL 6.9  7.4 7.2 7.2  8.3 High  R   Albumin 4.1 - 5.1 g/dL 4.9  5.3 High  R 4.8 R 4.8 R  4.8 R   Globulin, Total 1.5 - 4.5 g/dL 2.0  2.1 2.4 2.4     Albumin/Globulin Ratio 1.2 - 2.2 2.5 High   2.5 High  2.0 2.0     Bilirubin Total 0.0 -  1.2 mg/dL 1.2  1.2 1.2 1.3 High   1.7 High  R   Alkaline Phosphatase 44 - 121 IU/L 98  99 92 CM 99 R  92 R   LDH 121 - 224 IU/L 141  138 136 190     AST 0 - 40 IU/L 29  28 23 30  20 $ R   ALT 0 - 44 IU/L 49 High   39 31 37  31 R   GGT 0 - 65 IU/L 22  24 24 24     $ Iron 38 - 169 ug/dL 127  135 123 140     Cholesterol, Total 100 - 199 mg/dL 142 137 162 155 154     Triglycerides 0 - 149 mg/dL 107 130 134 113 136     HDL >39 mg/dL 39 Low  35 Low  38 Low  38 Low  36 Low      VLDL Cholesterol Cal 5 - 40 mg/dL 20 23 24 21 24     $ LDL Chol Calc (NIH) 0 - 99 mg/dL 83 79 100 High  96 94     Chol/HDL Ratio 0.0 - 5.0 ratio 3.6 3.9 CM 4.3 CM 4.1 CM 4.3 CM     Comment:                                   T. Chol/HDL Ratio                                             Men  Women                               1/2 Avg.Risk  3.4    3.3                                   Avg.Risk  5.0    4.4                                2X Avg.Risk  9.6    7.1                                3X Avg.Risk 23.4   11.0  Estimated CHD Risk 0.0 - 1.0 times avg. 0.6  0.8 CM 0.8 CM 0.8 CM     Comment: The CHD Risk is based on the T. Chol/HDL ratio. Other factors affect CHD Risk such as hypertension, smoking, diabetes, severe obesity, and family history of premature CHD.  TSH 0.450 - 4.500 uIU/mL 1.050  0.983 1.230 1.180     T4, Total 4.5 - 12.0 ug/dL 6.6  7.4 6.9 6.5     T3 Uptake Ratio 24 - 39 % 25  22 Low  24 23 Low      Free Thyroxine Index 1.2 - 4.9 1.7  1.6 1.7 1.5     Prostate Specific Ag, Serum 0.0 - 4.0 ng/mL 0.5  0.5 CM 0.4 CM 0.4 CM     Comment: Roche ECLIA methodology. According to the  American Urological Association, Serum  PSA should decrease and remain at undetectable levels after radical prostatectomy. The AUA defines biochemical recurrence as an initial PSA value 0.2 ng/mL or greater followed by a subsequent confirmatory PSA value 0.2 ng/mL or greater. Values obtained with different assay methods or kits cannot be used interchangeably. Results cannot be interpreted as absolute evidence of the presence or absence of malignant disease.  WBC 3.4 - 10.8 x10E3/uL 6.7  7.0 7.4 6.7   9.0 R  RBC 4.14 - 5.80 x10E6/uL 4.74  4.94 4.78 4.88   4.90 R  Hemoglobin 13.0 - 17.7 g/dL 14.2  14.6 14.2 14.6     Hematocrit 37.5 - 51.0 % 40.9  42.5 42.0 43.0     MCV 79 - 97 fL 86  86 88 88   89 R  MCH 26.6 - 33.0 pg 30.0  29.6 29.7 29.9   29.3 R  MCHC 31.5 - 35.7 g/dL 34.7  34.4 33.8 34.0   32.9 R  RDW 11.6 - 15.4 % 13.2  13.1 13.3 13.1   13.7 R  Platelets 150 - 450 x10E3/uL 320  296 282 304   354 R  Neutrophils Not Estab. % 54  56 57 54   60.9 R  Lymphs Not Estab. % 35  33 32 34     Monocytes Not Estab. % 8  8 8 9     $ Eos Not Estab. % 2  2 2 2     $ Basos Not Estab. % 1  1 1 1     $ Neutrophils Absolute 1.4 - 7.0 x10E3/uL 3.6  3.9 4.2 3.7   5.5 R  Lymphocytes Absolute 0.7 - 3.1 x10E3/uL 2.3  2.3 2.4 2.3   2.7 R  Monocytes Absolute 0.1 - 0.9 x10E3/uL 0.5  0.6 0.6 0.6     EOS (ABSOLUTE) 0.0 - 0.4 x10E3/uL 0.2  0.1 0.2 0.1     Basophils Absolute 0.0 - 0.2 x10E3/uL 0.1  0.1 0.1 0.1   0.1 R  Immature Granulocytes Not Estab. % 0  0 0 0                     ____________________________________________  EKG 61 beats of left atrial enlargement.  ____________________________________________    ____________________________________________   INITIAL IMPRESSION / ASSESSMENT AND PLANAs part of my medical decision making, I reviewed the following data within the [place]       Discussed physical exam and lab results with patient.  Patient advised continue previous  medication follow-up as needed.     ____________________________________________   FINAL CLINICAL IMPRESSION     ED Discharge Orders     None        Note:  This document was prepared using Dragon voice recognition software and may include unintentional dictation errors.

## 2022-08-25 ENCOUNTER — Other Ambulatory Visit: Payer: Self-pay

## 2022-08-25 DIAGNOSIS — E782 Mixed hyperlipidemia: Secondary | ICD-10-CM

## 2022-08-25 MED ORDER — ATORVASTATIN CALCIUM 40 MG PO TABS: 40.0000 mg | ORAL_TABLET | Freq: Every day | ORAL | 3 refills | Status: DC

## 2022-08-28 ENCOUNTER — Telehealth: Payer: 59 | Admitting: Family

## 2022-08-28 DIAGNOSIS — J019 Acute sinusitis, unspecified: Secondary | ICD-10-CM | POA: Diagnosis not present

## 2022-08-28 MED ORDER — AMOXICILLIN-POT CLAVULANATE 875-125 MG PO TABS: 1.0000 | ORAL_TABLET | Freq: Two times a day (BID) | ORAL | 0 refills | Status: DC

## 2022-08-28 NOTE — Progress Notes (Signed)

## 2022-09-15 ENCOUNTER — Ambulatory Visit: Payer: Self-pay | Admitting: Physician Assistant

## 2022-09-15 ENCOUNTER — Encounter: Payer: Self-pay | Admitting: Physician Assistant

## 2022-09-15 DIAGNOSIS — K2100 Gastro-esophageal reflux disease with esophagitis, without bleeding: Secondary | ICD-10-CM

## 2022-09-15 MED ORDER — PANTOPRAZOLE SODIUM 40 MG PO TBEC: 40.0000 mg | DELAYED_RELEASE_TABLET | Freq: Every day | ORAL | 3 refills | Status: DC

## 2022-09-15 NOTE — Progress Notes (Signed)
   Subjective: GERD and dysphagia    Patient ID: Jordan Sampson, male    DOB: Dec 05, 1975, 47 y.o.   MRN: [id number]  HPI Patient complain of a 20-year history of GERD.  Patient stated in the past 3 months he has developed dysphagia.  Patient states he can tolerate soft foods and fluids but is still uncomfortable.  History of different PPIs medication over the past 10 years.  States last medication and Nexium used to work until a few months ago.   Review of Systems Allergic rhinitis, depression, GERD, and hyperlipidemia.    Objective:   Physical Exam HEENT is unremarkable.  Neck is supple followed lymphadenopathy or bruits.  Lungs are clear to auscultation.  Heart regular rate and rhythm.  Abdomen with negative HSM, normoactive bowel sounds, soft and nontender to palpation.         Assessment & Plan: Gastric reflux and dysphagia.  Patient had a prescription for Protonix to consult to gastroenterologist.

## 2022-09-15 NOTE — Progress Notes (Signed)
Pt presents today for acid reflux. Pt states it has gotten worse over the past month and 1/2. Currently taking nexium. Has taken omeprazole in the past but only worked for two weeks. Pt feels like he has something stuck in his throat, belching and disliking the feeling of when he lays down and gets up he feels it coming up.

## 2022-09-15 NOTE — Addendum Note (Signed)
Addended by: Gardner Candle on: 09/15/2022 09:15 AM   Modules accepted: Orders

## 2022-12-05 ENCOUNTER — Encounter: Payer: Self-pay | Admitting: Physician Assistant

## 2022-12-05 ENCOUNTER — Ambulatory Visit: Payer: Self-pay | Admitting: Physician Assistant

## 2022-12-05 VITALS — BP 118/81 | HR 76 | Temp 97.7°F | Resp 14 | Ht 76.0 in | Wt 212.0 lb

## 2022-12-05 DIAGNOSIS — B079 Viral wart, unspecified: Secondary | ICD-10-CM

## 2022-12-05 NOTE — Progress Notes (Signed)
   Subjective:    Patient ID: Jordan Sampson, male    DOB: 1975-07-29, 47 y.o.   MRN: [id number]  HPI Patient presents with papular lesion left forearm.  No provocative or complaint.   Review of Systems Anxiety and depression    Objective:   Physical Exam BP 118/81  Pulse 76  Resp 14  Temp 97.7 F (36.5 C)  Temp src Temporal  SpO2 95 %  Weight 212 lb (96.2 kg)  Height 6\' 4"  (1.93 m)  Papular lesion left forearm consistent with wart       Assessment & Plan: Wart  Obtain consent for cryotherapy.  Patient vies follow-up in 1 week.

## 2022-12-05 NOTE — Progress Notes (Signed)
Pt requesting wart to frozen off if that's what is confirmed to be. Pt also states he may have pulled a muscle near right side of ribs x2 weeks. Hurts to touch.

## 2023-01-09 ENCOUNTER — Other Ambulatory Visit: Payer: Self-pay

## 2023-01-09 DIAGNOSIS — K2100 Gastro-esophageal reflux disease with esophagitis, without bleeding: Secondary | ICD-10-CM

## 2023-01-09 MED ORDER — PANTOPRAZOLE SODIUM 40 MG PO TBEC: 40.0000 mg | DELAYED_RELEASE_TABLET | Freq: Every day | ORAL | 3 refills | Status: DC

## 2023-01-10 DIAGNOSIS — R1319 Other dysphagia: Secondary | ICD-10-CM | POA: Diagnosis not present

## 2023-01-10 DIAGNOSIS — K219 Gastro-esophageal reflux disease without esophagitis: Secondary | ICD-10-CM | POA: Diagnosis not present

## 2023-01-10 DIAGNOSIS — Z800 Family history of malignant neoplasm of digestive organs: Secondary | ICD-10-CM | POA: Diagnosis not present

## 2023-02-06 ENCOUNTER — Ambulatory Visit
Admission: RE | Admit: 2023-02-06 | Discharge: 2023-02-06 | Disposition: A | Discharge status: Home / Self Care | Payer: 59 | Source: Ambulatory Visit | Attending: Physician Assistant | Admitting: Physician Assistant

## 2023-02-06 ENCOUNTER — Encounter: Payer: Self-pay | Admitting: Physician Assistant

## 2023-02-06 ENCOUNTER — Ambulatory Visit: Payer: Self-pay | Admitting: Physician Assistant

## 2023-02-06 VITALS — BP 129/80 | HR 70 | Temp 97.8°F | Resp 12 | Ht 76.0 in | Wt 215.0 lb

## 2023-02-06 DIAGNOSIS — N50812 Left testicular pain: Secondary | ICD-10-CM | POA: Diagnosis not present

## 2023-02-06 DIAGNOSIS — N5082 Scrotal pain: Secondary | ICD-10-CM | POA: Diagnosis not present

## 2023-02-06 LAB — POCT URINALYSIS DIPSTICK
Bilirubin, UA: NEGATIVE
Blood, UA: POSITIVE
Glucose, UA: NEGATIVE
Ketones, UA: NEGATIVE
Leukocytes, UA: NEGATIVE
Nitrite, UA: NEGATIVE
Protein, UA: NEGATIVE
Spec Grav, UA: 1.015 (ref 1.010–1.025)
Urobilinogen, UA: 0.2 U/dL
pH, UA: 6 (ref 5.0–8.0)

## 2023-02-06 NOTE — Progress Notes (Signed)
Left testicle pain x5 days - dull & aching & constant  Been taking Tylenol & ibuprofen - helped some  Walking causes discomfort - worse when up doing things.  No known injury - Denies heavy lifting or moving things - nothing more than what he normally does.  AMD

## 2023-02-06 NOTE — Progress Notes (Signed)
   Acute Office Visit  Subjective:     Patient ID: Jordan Sampson, male    DOB: [birth year], 47 y.o.   MRN: [id number]  Chief Complaint  Patient presents with   Testicle Pain    Testicle Pain The patient's primary symptoms include testicular pain.   Patient presents for evaluation of left testicular pain x 5 days rated at a 3/10 on this visit and at its worse. Denies tdysuria, redness, or swelling. States pain is worse with activity. Patient has tried Tylenol and Ibuprofen with no relief.   Review of Systems  Genitourinary:  Positive for testicular pain.   Negative except for above complaint.      Objective:    BP 129/80 (BP Location: Left Arm, Patient Position: Sitting, Cuff Size: Large)   Pulse 70   Temp 97.8 F (36.6 C) (Temporal)   Resp 12   Ht 6\' 4"  (1.93 m)   Wt 215 lb (97.5 kg)   SpO2 96%   BMI 26.17 kg/m    Physical Exam Constitutional:      Appearance: He is not ill-appearing.  Cardiovascular:     Rate and Rhythm: Normal rate and regular rhythm.  Pulmonary:     Effort: Pulmonary effort is normal.     Breath sounds: Normal breath sounds.  Genitourinary:    Penis: Normal.      Testes: Normal.  Musculoskeletal:        General: Normal range of motion.  Skin:    General: Skin is warm and dry.  Neurological:     General: No focal deficit present.     Mental Status: He is alert. Mental status is at baseline.  Psychiatric:        Mood and Affect: Mood normal.        Behavior: Behavior normal.     Results for orders placed or performed in visit on 02/06/23  POCT urinalysis dipstick  Result Value Ref Range   Color, UA Light Yellow    Clarity, UA Clear    Glucose, UA Negative Negative   Bilirubin, UA Negative    Ketones, UA Negative    Spec Grav, UA 1.015 1.010 - 1.025   Blood, UA Positive    pH, UA 6.0 5.0 - 8.0   Protein, UA Negative Negative   Urobilinogen, UA 0.2 0.2 or 1.0 E.U./dL   Nitrite, UA Negative    Leukocytes, UA Negative Negative    Appearance     Odor          Assessment & Plan:   Problem List Items Addressed This Visit   None Visit Diagnoses     Testicular pain, left    -  Primary   Relevant Orders   POCT urinalysis dipstick (Completed)   US Scrotum   US Art/Ven Flow Abd Pelv Doppler      Ultrasound imaging ordered. Patient advised to contact the office with any concerns or needs.   Delma Post, RN

## 2023-02-07 ENCOUNTER — Other Ambulatory Visit: Payer: Self-pay

## 2023-02-07 DIAGNOSIS — F418 Other specified anxiety disorders: Secondary | ICD-10-CM

## 2023-02-07 MED ORDER — SERTRALINE HCL 100 MG PO TABS: 300.0000 mg | ORAL_TABLET | Freq: Every day | ORAL | 3 refills | Status: DC

## 2023-02-09 ENCOUNTER — Encounter: Payer: Self-pay | Admitting: Physician Assistant

## 2023-02-09 ENCOUNTER — Ambulatory Visit: Payer: Self-pay | Admitting: Physician Assistant

## 2023-02-09 VITALS — BP 122/84 | HR 69 | Temp 97.7°F | Resp 16 | Ht 76.0 in | Wt 214.0 lb

## 2023-02-09 DIAGNOSIS — N50812 Left testicular pain: Secondary | ICD-10-CM

## 2023-02-09 DIAGNOSIS — R319 Hematuria, unspecified: Secondary | ICD-10-CM

## 2023-02-09 LAB — POCT URINALYSIS DIPSTICK
Bilirubin, UA: NEGATIVE
Glucose, UA: NEGATIVE
Ketones, UA: NEGATIVE
Leukocytes, UA: NEGATIVE
Nitrite, UA: NEGATIVE
Protein, UA: NEGATIVE
Spec Grav, UA: 1.02 (ref 1.010–1.025)
Urobilinogen, UA: 0.2 U/dL
pH, UA: 6 (ref 5.0–8.0)

## 2023-02-09 NOTE — Progress Notes (Signed)
  Subjective:     Patient ID: Jordan Sampson, male    DOB: April 12, 1976, 47 y.o.   MRN: [id number]  No chief complaint on file.   HPI Patient presents for follow up evaluation of left testicular pain. Patient seen in this clinic on 02/06/2023 with ordered imaging. Imaging is unremarkable and has been reviewed with patient. Patient reports continued discomfort with pain at a 3/10 on this visit and at its worse. States pain is increased with activity. Reports taking ibuprofen and tylenol with partial and temporary relief. Denies dysuria, redness, and swelling.   ROS Negative except for above complaint.      Objective:    BP 122/84   Pulse 69   Temp 97.7 F (36.5 C)   Resp 16   Ht 6\' 4"  (1.93 m)   Wt 214 lb (97.1 kg)   SpO2 98%   BMI 26.05 kg/m    Physical Exam Constitutional:      Appearance: He is not ill-appearing.  Cardiovascular:     Rate and Rhythm: Normal rate and regular rhythm.     Pulses: Normal pulses.     Heart sounds: Normal heart sounds.  Pulmonary:     Effort: Pulmonary effort is normal.     Breath sounds: Normal breath sounds.  Genitourinary:    Penis: Normal.      Testes: Normal.  Skin:    General: Skin is warm and dry.  Neurological:     General: No focal deficit present.     Mental Status: He is alert. Mental status is at baseline.  Psychiatric:        Mood and Affect: Mood normal.        Behavior: Behavior normal.         Assessment & Plan:   Problem List Items Addressed This Visit   None Visit Diagnoses     Left testicular pain    -  Primary   Hematuria, unspecified type       Relevant Orders   POCT urinalysis dipstick (Completed)       Patient with continued left testicular pain and hematuria. Consult to urology.   Imaging results reviewed and discussed with patient. Encouraged to contact office with concerns or needs.   Delma Post, RN

## 2023-02-09 NOTE — Progress Notes (Signed)
Pt presents today for follow up and lab results.  Pt states he still feels aggravating pain when not alternating ibuprofen and tylenol.  Jordan Sampson

## 2023-02-10 NOTE — Addendum Note (Signed)
Addended by: Gardner Candle on: 02/10/2023 08:38 AM   Modules accepted: Orders

## 2023-02-22 ENCOUNTER — Ambulatory Visit: Payer: Self-pay

## 2023-02-22 DIAGNOSIS — Z23 Encounter for immunization: Secondary | ICD-10-CM

## 2023-02-27 DIAGNOSIS — H5213 Myopia, bilateral: Secondary | ICD-10-CM | POA: Diagnosis not present

## 2023-02-28 ENCOUNTER — Ambulatory Visit: Payer: 59 | Admitting: Urology

## 2023-02-28 ENCOUNTER — Encounter: Payer: Self-pay | Admitting: Urology

## 2023-02-28 ENCOUNTER — Other Ambulatory Visit: Payer: Self-pay

## 2023-02-28 ENCOUNTER — Other Ambulatory Visit
Admission: RE | Admit: 2023-02-28 | Discharge: 2023-02-28 | Disposition: A | Discharge status: Home / Self Care | Payer: 59 | Attending: Urology | Admitting: Urology

## 2023-02-28 VITALS — BP 139/78 | HR 74 | Ht 76.0 in | Wt 215.0 lb

## 2023-02-28 DIAGNOSIS — N50812 Left testicular pain: Secondary | ICD-10-CM | POA: Insufficient documentation

## 2023-02-28 DIAGNOSIS — R3129 Other microscopic hematuria: Secondary | ICD-10-CM

## 2023-02-28 DIAGNOSIS — R102 Pelvic and perineal pain: Secondary | ICD-10-CM | POA: Diagnosis not present

## 2023-02-28 LAB — URINALYSIS, COMPLETE (UACMP) WITH MICROSCOPIC
Bilirubin Urine: NEGATIVE
Glucose, UA: NEGATIVE mg/dL
Ketones, ur: NEGATIVE mg/dL
Leukocytes,Ua: NEGATIVE
Nitrite: NEGATIVE
Protein, ur: NEGATIVE mg/dL
Specific Gravity, Urine: 1.015 (ref 1.005–1.030)
pH: 6 (ref 5.0–8.0)

## 2023-02-28 MED ORDER — CELECOXIB 200 MG PO CAPS: 200.0000 mg | ORAL_CAPSULE | Freq: Two times a day (BID) | ORAL | 0 refills | Status: DC

## 2023-02-28 NOTE — Patient Instructions (Signed)
Pelvic Floor Dysfunction, Male     Pelvic floor dysfunction (PFD) is a condition that results when the group of muscles and connective tissues that support the organs in the pelvis (pelvic floor muscles) do not work well. These muscles and their connections form a sling that supports the colon and bladder. In men, these muscles also support the prostate gland. PFD causes pelvic floor muscles to be too weak, too tight, or both. In PFD, muscle movements are not coordinated. This may cause bowel or bladder problems. It may also cause pain. What are the causes? This condition may be caused by an injury to the pelvic area or by a weakening of pelvic muscles. In many cases, the exact cause is not known. What increases the risk? The following factors may make you more likely to develop PFD: Having chronic bladder tissue inflammation (interstitial cystitis). Being an older person. Being overweight. History of radiation treatment for cancer in the pelvic region. Previous pelvic surgery, such as removal of the prostate gland (prostatectomy). What are the signs or symptoms? Symptoms of this condition vary and may include: Bladder symptoms, such as: Trouble starting urination and emptying the bladder. Frequent urinary tract infections. Leaking urine when coughing, laughing, or exercising (stress incontinence). Having to pass urine urgently or frequently. Pain when passing urine. Bowel symptoms, such as: Constipation. Urgent or frequent bowel movements. Incomplete bowel movements. Painful bowel movements. Leaking stool or gas. Unexplained genital or rectal pain. Genital or rectal muscle spasms. Low back pain. Sexual dysfunction, such as erectile dysfunction, premature ejaculation, or pain during or after sexual activity. How is this diagnosed? This condition is diagnosed based on: Your symptoms and medical history. A physical exam. During the exam, your health care provider may check your  pelvic muscles for tightness, spasm, pain, or weakness. This may include a rectal exam. In some cases, you may have diagnostic tests, such as: Electrical muscle function tests. Urine flow testing. X-ray tests of bowel function. Ultrasound of the pelvic organs. How is this treated? Treatment for this condition depends on your symptoms. Treatment options include: Physical therapy. This may include Kegel exercises to help relax or strengthen the pelvic floor muscles. Biofeedback. This type of therapy provides feedback on how tight your pelvic floor muscles are so that you can learn to control them. Massage therapy. A treatment that involves electrical stimulation of the pelvic floor muscles to help control pain (transcutaneous electrical nerve stimulation, or TENS). Sound wave therapy (ultrasound) to reduce muscle spasms. Medicines, such as: Muscle relaxants. Bladder control medicines. Surgery to reconstruct or support pelvic floor muscles may be an option if other treatments do not help. Follow these instructions at home: Activity Do your usual activities as told by your health care provider. Ask your health care provider if you should modify any activities. Do pelvic floor strengthening or relaxing exercises at home as told by your physical therapist. Lifestyle Maintain a healthy weight. Eat foods that are high in fiber, such as beans, whole grains, and fresh fruits and vegetables. Limit foods that are high in fat and processed sugars, such as fried or sweet foods. Manage stress with relaxation techniques such as yoga or meditation. General instructions If you have problems with leakage: Use absorbable pads or wear padded underwear. Wash your genital and anal area frequently with mild soap. Keep your genital and anal area as clean and dry as possible. Ask your health care provider if you should try a barrier cream to prevent skin irritation. Take warm  baths to relieve pelvic muscle  tension or spasms. Take over-the-counter and prescription medicines only as told by your health care provider. Keep all follow-up visits. How is this prevented? The cause of PFD is not always known, but there are a few things you can do to reduce the risk of developing this condition, including: Staying at a healthy weight. Getting regular exercise. Managing stress. Contact a health care provider if: Your symptoms are not improving with home care. You have signs or symptoms of PFD that get worse. You develop new signs or symptoms. You have signs of a urinary tract infection, such as: Fever. Chills. Increased urinary frequency. A burning feeling when urinating. You have not had a bowel movement in 3 days (constipation). Summary Pelvic floor dysfunction results when the muscles and connective tissues in your pelvic floor do not work well. These muscles and their connections form a sling that supports your colon and bladder. In men, these muscles also support the prostate gland. PFD may be caused by an injury to the pelvic area or by a weakening of pelvic muscles. PFD causes pelvic floor muscles to be too weak, too tight, or a combination of both. Symptoms may vary from person to person. In most cases, PFD can be treated with physical therapies and medicines. Surgery may be an option if other treatments do not help. This information is not intended to replace advice given to you by your health care provider. Make sure you discuss any questions you have with your health care provider. Document Revised: 08/26/2020 Document Reviewed: 08/26/2020 Elsevier Patient Education  2024 [employer].

## 2023-02-28 NOTE — Progress Notes (Signed)
   02/28/23 1:34 PM   Jordan Sampson [birth year] [id number]  CC: Left scrotal pain, microscopic hematuria  HPI: 47 year old male who reports about 6 weeks of intermittent left scrotal pain, denies any urinary symptoms.  The pain can radiate into the groin.  He had a scrotal ultrasound on 02/06/2023 that was benign.  He also has recent microscopic hematuria with 6-10 RBCs seen on urinalysis today, denies any gross hematuria.  He has been taking ibuprofen for the scrotal pain which has improved his symptoms.  PMH: Past Medical History:  Diagnosis Date   Anxiety associated with depression    Elevated blood pressure    Hypertension    Inappropriate sinus tachycardia (HCC)    associated with exercise   Lightheadedness -spells     Surgical History: Past Surgical History:  Procedure Laterality Date   COLONOSCOPY WITH PROPOFOL N/A 10/09/2015   Procedure: COLONOSCOPY WITH PROPOFOL;  Surgeon: Scot Jun, MD;  Location: [facility];  Service: Endoscopy;  Laterality: N/A;       Family History: Family History  Problem Relation Age of Onset   Coronary artery disease Mother        less than 25 yo   Other Mother 16       triple bypass surgery   Heart disease Mother     Social History:  reports that he quit smoking about 23 years ago. His smoking use included cigarettes. He started smoking about 27 years ago. He has a 4 pack-year smoking history. His smokeless tobacco use includes snuff. He reports that he does not drink alcohol and does not use drugs.  Physical Exam: BP 139/78 (BP Location: Left Arm, Patient Position: Sitting, Cuff Size: Large)   Pulse 74   Ht 6\' 4"  (1.93 m)   Wt 215 lb (97.5 kg)   BMI 26.17 kg/m    Constitutional:  Alert and oriented, No acute distress. Cardiovascular: No clubbing, cyanosis, or edema. Respiratory: Normal respiratory effort, no increased work of breathing. GI: Abdomen is soft, nontender, nondistended, no abdominal masses GU: Normal  phallus with patent meatus, no lesions, testicles 20 cc and 7 bilaterally without masses, left testicle mildly tender   Pertinent Imaging: I have personally viewed and interpreted the scrotal ultrasound showing no abnormalities.  Assessment & Plan:   47 year old male with left-sided testicular pain and benign ultrasound, we discussed the concept of pelvic floor dysfunction, and I recommended a trial of 10 days of Celebrex and pelvic floor stretching exercises were provided.  Reassurance provided regarding normal ultrasound and physical exam.  We discussed common possible etiologies of microscopic hematuria including idiopathic, urolithiasis, medical renal disease, and malignancy. We discussed the new asymptomatic microscopic hematuria guidelines and risk categories of low, intermediate, and high risk that are based on age, risk factors like smoking, and degree of microscopic hematuria. We discussed work-up can range from repeat urinalysis, renal ultrasound and cystoscopy, to CT urogram and cystoscopy.  They fall into the intermediate risk category, and I recommended proceeding with scrotal ultrasound and cystoscopy, he defers cystoscopy and would like to start with the renal ultrasound.   Trial of Celebrex and pelvic floor stretching exercises for scrotal pain Renal/bladder ultrasound for further evaluation of microscopic hematuria, he defers cystoscopy at this time  Legrand Rams, MD 02/28/2023  [facility] [address] [place], [place] [address] [phone]

## 2023-03-07 ENCOUNTER — Ambulatory Visit
Admission: RE | Admit: 2023-03-07 | Discharge: 2023-03-07 | Disposition: A | Discharge status: Home / Self Care | Payer: 59 | Source: Ambulatory Visit | Attending: Urology | Admitting: Urology

## 2023-03-07 DIAGNOSIS — R3129 Other microscopic hematuria: Secondary | ICD-10-CM | POA: Insufficient documentation

## 2023-03-24 ENCOUNTER — Encounter: Payer: Self-pay | Admitting: *Deleted

## 2023-04-14 ENCOUNTER — Encounter: Payer: Self-pay | Admitting: *Deleted

## 2023-04-14 ENCOUNTER — Ambulatory Visit: Payer: 59 | Admitting: Anesthesiology

## 2023-04-14 ENCOUNTER — Ambulatory Visit
Admission: RE | Admit: 2023-04-14 | Discharge: 2023-04-14 | Disposition: A | Discharge status: Home / Self Care | Payer: 59 | Attending: Gastroenterology | Admitting: Gastroenterology

## 2023-04-14 ENCOUNTER — Encounter
Admission: RE | Disposition: A | Discharge status: Home / Self Care | Payer: Self-pay | Source: Home / Self Care | Attending: Gastroenterology

## 2023-04-14 DIAGNOSIS — K200 Eosinophilic esophagitis: Secondary | ICD-10-CM | POA: Diagnosis not present

## 2023-04-14 DIAGNOSIS — K649 Unspecified hemorrhoids: Secondary | ICD-10-CM | POA: Diagnosis not present

## 2023-04-14 DIAGNOSIS — Z1211 Encounter for screening for malignant neoplasm of colon: Secondary | ICD-10-CM | POA: Insufficient documentation

## 2023-04-14 DIAGNOSIS — R1310 Dysphagia, unspecified: Secondary | ICD-10-CM | POA: Diagnosis not present

## 2023-04-14 DIAGNOSIS — K219 Gastro-esophageal reflux disease without esophagitis: Secondary | ICD-10-CM | POA: Diagnosis not present

## 2023-04-14 DIAGNOSIS — K640 First degree hemorrhoids: Secondary | ICD-10-CM | POA: Insufficient documentation

## 2023-04-14 DIAGNOSIS — I10 Essential (primary) hypertension: Secondary | ICD-10-CM | POA: Insufficient documentation

## 2023-04-14 DIAGNOSIS — Z800 Family history of malignant neoplasm of digestive organs: Secondary | ICD-10-CM | POA: Diagnosis not present

## 2023-04-14 DIAGNOSIS — Z87891 Personal history of nicotine dependence: Secondary | ICD-10-CM | POA: Diagnosis not present

## 2023-04-14 DIAGNOSIS — K3189 Other diseases of stomach and duodenum: Secondary | ICD-10-CM | POA: Diagnosis not present

## 2023-04-14 HISTORY — PX: ESOPHAGOGASTRODUODENOSCOPY (EGD) WITH PROPOFOL: SHX5813

## 2023-04-14 HISTORY — PX: COLONOSCOPY WITH PROPOFOL: SHX5780

## 2023-04-14 HISTORY — PX: BIOPSY: SHX5522

## 2023-04-14 SURGERY — COLONOSCOPY WITH PROPOFOL
Anesthesia: General

## 2023-04-14 MED ORDER — SODIUM CHLORIDE 0.9 % IV SOLN: INTRAVENOUS | Status: DC

## 2023-04-14 MED ORDER — FENTANYL CITRATE (PF) 100 MCG/2ML IJ SOLN
INTRAMUSCULAR | Status: DC | PRN
  Administered 2023-04-14: 50 ug via INTRAVENOUS

## 2023-04-14 MED ORDER — PROPOFOL 10 MG/ML IV BOLUS
INTRAVENOUS | Status: AC
  Filled 2023-04-14: qty 40

## 2023-04-14 MED ORDER — FENTANYL CITRATE (PF) 100 MCG/2ML IJ SOLN
INTRAMUSCULAR | Status: AC
  Filled 2023-04-14: qty 2

## 2023-04-14 MED ORDER — LIDOCAINE HCL (CARDIAC) PF 100 MG/5ML IV SOSY
PREFILLED_SYRINGE | INTRAVENOUS | Status: DC | PRN
  Administered 2023-04-14: 100 mg via INTRAVENOUS

## 2023-04-14 MED ORDER — PROPOFOL 10 MG/ML IV BOLUS
INTRAVENOUS | Status: DC | PRN
  Administered 2023-04-14: 80 mg via INTRAVENOUS
  Administered 2023-04-14: 120 ug/kg/min via INTRAVENOUS

## 2023-04-14 MED ORDER — LIDOCAINE HCL (PF) 2 % IJ SOLN
INTRAMUSCULAR | Status: AC
  Filled 2023-04-14: qty 5

## 2023-04-14 NOTE — Op Note (Signed)
[facility] Patient Name: Jordan Sampson Procedure Date: 04/14/2023 10:26 AM MRN: [id number] Account #: [id number] Date of Birth: 1976-02-23 Admit Type: Outpatient Age: 47 Room: [facility] ENDO ROOM 3 Gender: Male Note Status: Finalized Instrument Name: Upper Endoscope [id number] Procedure:             Upper GI endoscopy Indications:           Dysphagia, Gastro-esophageal reflux disease Providers:             Eather Colas MD, MD Medicines:             Monitored Anesthesia Care Complications:         No immediate complications. Estimated blood loss:                         Minimal. Procedure:             Pre-Anesthesia Assessment:                        - Prior to the procedure, a History and Physical was                         performed, and patient medications and allergies were                         reviewed. The patient is competent. The risks and                         benefits of the procedure and the sedation options and                         risks were discussed with the patient. All questions                         were answered and informed consent was obtained.                         Patient identification and proposed procedure were                         verified by the physician, the nurse, the                         anesthesiologist, the anesthetist and the technician                         in the endoscopy suite. Mental Status Examination:                         alert and oriented. Airway Examination: normal                         oropharyngeal airway and neck mobility. Respiratory                         Examination: clear to auscultation. CV Examination:                         normal. Prophylactic Antibiotics: The patient does not  require prophylactic antibiotics. Prior                         Anticoagulants: The patient has taken no anticoagulant                         or antiplatelet agents.  ASA Grade Assessment: II - A                         patient with mild systemic disease. After reviewing                         the risks and benefits, the patient was deemed in                         satisfactory condition to undergo the procedure. The                         anesthesia plan was to use monitored anesthesia care                         (MAC). Immediately prior to administration of                         medications, the patient was re-assessed for adequacy                         to receive sedatives. The heart rate, respiratory                         rate, oxygen saturations, blood pressure, adequacy of                         pulmonary ventilation, and response to care were                         monitored throughout the procedure. The physical                         status of the patient was re-assessed after the                         procedure.                        After obtaining informed consent, the endoscope was                         passed under direct vision. Throughout the procedure,                         the patient's blood pressure, pulse, and oxygen                         saturations were monitored continuously. The Endoscope                         was introduced through the mouth, and advanced to the  second part of duodenum. The upper GI endoscopy was                         accomplished without difficulty. The patient tolerated                         the procedure well. Findings:      No endoscopic abnormality was evident in the esophagus to explain the       patient's complaint of dysphagia. Biopsies were obtained from the       proximal and distal esophagus with cold forceps for histology of       suspected eosinophilic esophagitis. Estimated blood loss was minimal.      The entire examined stomach was normal. Biopsies were taken with a cold       forceps for Helicobacter pylori testing. Estimated blood loss was        minimal.      The examined duodenum was normal. Impression:            - No endoscopic esophageal abnormality to explain                         patient's dysphagia.                        - Normal stomach. Biopsied.                        - Normal examined duodenum.                        - Biopsies were taken with a cold forceps for                         evaluation of eosinophilic esophagitis. Recommendation:        - Discharge patient to home.                        - Resume previous diet.                        - Continue present medications.                        - Await pathology results.                        - Return to referring physician as previously                         scheduled. Procedure Code(s):     --- Professional ---                        [phone], Esophagogastroduodenoscopy, flexible,                         transoral; with biopsy, single or multiple Diagnosis Code(s):     --- Professional ---                        R13.10, Dysphagia, unspecified  K21.9, Gastro-esophageal reflux disease without                         esophagitis CPT copyright 2022 American Medical Association. All rights reserved. The codes documented in this report are preliminary and upon coder review may  be revised to meet current compliance requirements. Eather Colas MD, MD 04/14/2023 11:10:17 AM Number of Addenda: 0 Note Initiated On: 04/14/2023 10:26 AM Estimated Blood Loss:  Estimated blood loss was minimal.      [facility]

## 2023-04-14 NOTE — Interval H&P Note (Signed)
History and Physical Interval Note:  04/14/2023 10:31 AM  Jordan Sampson  has presented today for surgery, with the diagnosis of GERD Dysphagia FH Colon Cancer.  The various methods of treatment have been discussed with the patient and family. After consideration of risks, benefits and other options for treatment, the patient has consented to  Procedure(s): COLONOSCOPY WITH PROPOFOL (N/A) ESOPHAGOGASTRODUODENOSCOPY (EGD) WITH PROPOFOL (N/A) as a surgical intervention.  The patient's history has been reviewed, patient examined, no change in status, stable for surgery.  I have reviewed the patient's chart and labs.  Questions were answered to the patient's satisfaction.     Regis Bill  Ok to proceed with EGD/Colonoscopy

## 2023-04-14 NOTE — Anesthesia Postprocedure Evaluation (Signed)
Anesthesia Post Note  Patient: Jordan Sampson  Procedure(s) Performed: COLONOSCOPY WITH PROPOFOL ESOPHAGOGASTRODUODENOSCOPY (EGD) WITH PROPOFOL BIOPSY  Patient location during evaluation: Endoscopy Anesthesia Type: General Level of consciousness: awake and alert Pain management: pain level controlled Vital Signs Assessment: post-procedure vital signs reviewed and stable Respiratory status: spontaneous breathing, nonlabored ventilation, respiratory function stable and patient connected to nasal cannula oxygen Cardiovascular status: blood pressure returned to baseline and stable Postop Assessment: no apparent nausea or vomiting Anesthetic complications: no   No notable events documented.   Last Vitals:  Vitals:   04/14/23 1107 04/14/23 1123  BP: 106/73 134/86  Pulse: 70 60  Resp: 14 12  Temp: (!) 35.3 C   SpO2: 95% 98%    Last Pain:  Vitals:   04/14/23 1123  TempSrc:   PainSc: 0-No pain                 Louie Boston

## 2023-04-14 NOTE — Anesthesia Preprocedure Evaluation (Addendum)
Anesthesia Evaluation  Patient identified by MRN, date of birth, ID band Patient awake    Reviewed: Allergy & Precautions, NPO status , Patient's Chart, lab work & pertinent test results  History of Anesthesia Complications Negative for: history of anesthetic complications  Airway Mallampati: I  TM Distance: >3 FB Neck ROM: full    Dental no notable dental hx.    Pulmonary former smoker   Pulmonary exam normal        Cardiovascular hypertension, Normal cardiovascular exam     Neuro/Psych  PSYCHIATRIC DISORDERS Anxiety Depression    negative neurological ROS     GI/Hepatic negative GI ROS, Neg liver ROS,,,  Endo/Other  negative endocrine ROS    Renal/GU negative Renal ROS  negative genitourinary   Musculoskeletal   Abdominal   Peds  Hematology negative hematology ROS (+)   Anesthesia Other Findings Past Medical History: No date: Anxiety associated with depression No date: Elevated blood pressure No date: Hypertension No date: Inappropriate sinus tachycardia (HCC)     Comment:  associated with exercise No date: Lightheadedness -spells  Past Surgical History: 10/09/2015: COLONOSCOPY WITH PROPOFOL; N/A     Comment:  Procedure: COLONOSCOPY WITH PROPOFOL;  Surgeon: Scot Jun, MD;  Location: [facility] ENDOSCOPY;  Service:               Endoscopy;  Laterality: N/A;  BMI    Body Mass Index: 25.08 kg/m      Reproductive/Obstetrics negative OB ROS                             Anesthesia Physical Anesthesia Plan  ASA: 2  Anesthesia Plan: General   Post-op Pain Management: Minimal or no pain anticipated   Induction: Intravenous  PONV Risk Score and Plan: 1 and Propofol infusion and TIVA  Airway Management Planned: Natural Airway and Nasal Cannula  Additional Equipment:   Intra-op Plan:   Post-operative Plan:   Informed Consent: I have reviewed the patients  History and Physical, chart, labs and discussed the procedure including the risks, benefits and alternatives for the proposed anesthesia with the patient or authorized representative who has indicated his/her understanding and acceptance.     Dental Advisory Given  Plan Discussed with: Anesthesiologist, CRNA and Surgeon  Anesthesia Plan Comments: (Patient consented for risks of anesthesia including but not limited to:  - adverse reactions to medications - risk of airway placement if required - damage to eyes, teeth, lips or other oral mucosa - nerve damage due to positioning  - sore throat or hoarseness - Damage to heart, brain, nerves, lungs, other parts of body or loss of life  Patient voiced understanding and assent.)       Anesthesia Quick Evaluation

## 2023-04-14 NOTE — Op Note (Signed)
[facility] Patient Name: Jordan Sampson Procedure Date: 04/14/2023 10:26 AM MRN: [id number] Account #: [id number] Date of Birth: 04/12/1976 Admit Type: Outpatient Age: 47 Room: [facility] ENDO ROOM 3 Gender: Male Note Status: Finalized Instrument Name: Prentice Docker [id number] Procedure:             Colonoscopy Indications:           Screening in patient at increased risk: Colorectal                         cancer in father before age 32 Providers:             Eather Colas MD, MD Medicines:             Monitored Anesthesia Care Complications:         No immediate complications. Procedure:             Pre-Anesthesia Assessment:                        - Prior to the procedure, a History and Physical was                         performed, and patient medications and allergies were                         reviewed. The patient is competent. The risks and                         benefits of the procedure and the sedation options and                         risks were discussed with the patient. All questions                         were answered and informed consent was obtained.                         Patient identification and proposed procedure were                         verified by the physician, the nurse, the                         anesthesiologist, the anesthetist and the technician                         in the endoscopy suite. Mental Status Examination:                         alert and oriented. Airway Examination: normal                         oropharyngeal airway and neck mobility. Respiratory                         Examination: clear to auscultation. CV Examination:                         normal. Prophylactic Antibiotics: The patient does not  require prophylactic antibiotics. Prior                         Anticoagulants: The patient has taken no anticoagulant                         or antiplatelet agents. ASA  Grade Assessment: II - A                         patient with mild systemic disease. After reviewing                         the risks and benefits, the patient was deemed in                         satisfactory condition to undergo the procedure. The                         anesthesia plan was to use monitored anesthesia care                         (MAC). Immediately prior to administration of                         medications, the patient was re-assessed for adequacy                         to receive sedatives. The heart rate, respiratory                         rate, oxygen saturations, blood pressure, adequacy of                         pulmonary ventilation, and response to care were                         monitored throughout the procedure. The physical                         status of the patient was re-assessed after the                         procedure.                        After obtaining informed consent, the colonoscope was                         passed under direct vision. Throughout the procedure,                         the patient's blood pressure, pulse, and oxygen                         saturations were monitored continuously. The                         Colonoscope was introduced through the anus and  advanced to the the cecum, identified by appendiceal                         orifice and ileocecal valve. The colonoscopy was                         performed without difficulty. The patient tolerated                         the procedure well. The quality of the bowel                         preparation was adequate to identify polyps. The                         ileocecal valve, appendiceal orifice, and rectum were                         photographed. Findings:      The perianal and digital rectal examinations were normal.      Internal hemorrhoids were found during retroflexion. The hemorrhoids       were Grade I (internal hemorrhoids  that do not prolapse).      The exam was otherwise without abnormality on direct and retroflexion       views. Impression:            - Internal hemorrhoids.                        - The examination was otherwise normal on direct and                         retroflexion views.                        - No specimens collected. Recommendation:        - Discharge patient to home.                        - Resume previous diet.                        - Continue present medications.                        - Repeat colonoscopy in 5 years for screening purposes.                        - Return to referring physician as previously                         scheduled. Procedure Code(s):     --- Professional ---                        Z6109, Colorectal cancer screening; colonoscopy on                         individual at high risk Diagnosis Code(s):     --- Professional ---  Z80.0, Family history of malignant neoplasm of                         digestive organs                        K64.0, First degree hemorrhoids CPT copyright 2022 American [employer] Association. All rights reserved. The codes documented in this report are preliminary and upon coder review may  be revised to meet current compliance requirements. Eather Colas MD, MD 04/14/2023 11:14:53 AM Number of Addenda: 0 Note Initiated On: 04/14/2023 10:26 AM Scope Withdrawal Time: 0 hours 9 minutes 45 seconds  Total Procedure Duration: 0 hours 15 minutes 40 seconds  Estimated Blood Loss:  Estimated blood loss: none.      [facility]

## 2023-04-14 NOTE — H&P (Signed)
Outpatient short stay form Pre-procedure 04/14/2023  Regis Bill, MD  Primary Physician: Joni Reining, PA-C  Reason for visit:  Dysphagia/High risk colon cancer screening  History of present illness:    47 y/o gentleman with history of HLD and hypertension here for EGD/Colonoscopy for GERD/solid food dysphagia and colon cancer screening. Last colonoscopy in 2017 was normal. Father had colon cancer at age 81. No blood thinners. No significant abdominal surgeries. His solid food dysphagia is better on new PPI but still happens occasionally.    Current Facility-Administered Medications:    0.9 %  sodium chloride infusion, , Intravenous, Continuous, Givanni Staron, Rossie Muskrat, MD, Last Rate: 20 mL/hr at 04/14/23 0944, New Bag at 04/14/23 0944  Medications Prior to Admission  Medication Sig Dispense Refill Last Dose/Taking   atorvastatin (LIPITOR) 40 MG tablet Take 1 tablet (40 mg total) by mouth daily. 90 tablet 3 04/13/2023   celecoxib (CELEBREX) 200 MG capsule Take 1 capsule (200 mg total) by mouth 2 (two) times daily. 20 capsule 0 04/13/2023   pantoprazole (PROTONIX) 40 MG tablet Take 1 tablet (40 mg total) by mouth daily. 90 tablet 3 04/13/2023   sertraline (ZOLOFT) 100 MG tablet Take 3 tablets (300 mg total) by mouth daily. 270 tablet 3 04/13/2023   EPINEPHrine 0.3 mg/0.3 mL IJ SOAJ injection Inject 0.3 mg into the muscle as needed for anaphylaxis. (Patient not taking: Reported on 02/28/2023) 1 each 1    fluticasone (FLONASE) 50 MCG/ACT nasal spray Place 2 sprays into both nostrils daily. 16 g 6      No Known Allergies   Past Medical History:  Diagnosis Date   Anxiety associated with depression    Elevated blood pressure    Hypertension    Inappropriate sinus tachycardia (HCC)    associated with exercise   Lightheadedness -spells     Review of systems:  Otherwise negative.    Physical Exam  Gen: Alert, oriented. Appears stated age.  HEENT: PERRLA. Lungs: No  respiratory distress CV: RRR Abd: soft, benign, no masses Ext: No edema    Planned procedures: Proceed with EGD/colonoscopy. The patient understands the nature of the planned procedure, indications, risks, alternatives and potential complications including but not limited to bleeding, infection, perforation, damage to internal organs and possible oversedation/side effects from anesthesia. The patient agrees and gives consent to proceed.  Please refer to procedure notes for findings, recommendations and patient disposition/instructions.     Regis Bill, MD [facility]

## 2023-04-14 NOTE — Transfer of Care (Signed)
Immediate Anesthesia Transfer of Care Note  Patient: Jordan Sampson  Procedure(s) Performed: COLONOSCOPY WITH PROPOFOL ESOPHAGOGASTRODUODENOSCOPY (EGD) WITH PROPOFOL BIOPSY  Patient Location: PACU  Anesthesia Type:MAC  Level of Consciousness: sedated  Airway & Oxygen Therapy: Patient Spontanous Breathing  Post-op Assessment: Report given to RN and Post -op Vital signs reviewed and stable  Post vital signs: stable  Last Vitals:  Vitals Value Taken Time  BP 106/53   Temp 96   Pulse 68   Resp 16   SpO2 96     Last Pain:  Vitals:   04/14/23 0931  TempSrc: Temporal  PainSc: 2          Complications: No notable events documented.

## 2023-04-17 ENCOUNTER — Encounter: Payer: Self-pay | Admitting: Gastroenterology

## 2023-04-17 LAB — SURGICAL PATHOLOGY

## 2023-06-10 ENCOUNTER — Telehealth: Payer: 59 | Admitting: Nurse Practitioner

## 2023-06-10 DIAGNOSIS — J019 Acute sinusitis, unspecified: Secondary | ICD-10-CM | POA: Diagnosis not present

## 2023-06-10 DIAGNOSIS — B9789 Other viral agents as the cause of diseases classified elsewhere: Secondary | ICD-10-CM

## 2023-06-10 DIAGNOSIS — B9689 Other specified bacterial agents as the cause of diseases classified elsewhere: Secondary | ICD-10-CM

## 2023-06-10 MED ORDER — PREDNISONE 20 MG PO TABS: 20.0000 mg | ORAL_TABLET | Freq: Every day | ORAL | 0 refills | Status: DC

## 2023-06-10 MED ORDER — IPRATROPIUM BROMIDE 0.03 % NA SOLN: 2.0000 | Freq: Two times a day (BID) | NASAL | 0 refills | Status: DC

## 2023-06-10 NOTE — Progress Notes (Signed)
 I have spent 5 minutes in review of e-visit questionnaire, review and updating patient chart, medical decision making and response to patient.   Claiborne Rigg, NP

## 2023-06-10 NOTE — Progress Notes (Signed)
 E-Visit for Sinus Problems  We are sorry that you are not feeling well.  Here is how we plan to help!  Based on what you have shared with me it looks like you have sinusitis.  Sinusitis is inflammation and infection in the sinus cavities of the head.  Based on your presentation I believe you most likely have Acute Viral Sinusitis.This is an infection most likely caused by a virus. There is not specific treatment for viral sinusitis other than to help you with the symptoms until the infection runs its course.  You may use an oral decongestant such as Mucinex D or if you have glaucoma or high blood pressure use plain Mucinex. Saline nasal spray help and can safely be used as often as needed for congestion, I have prescribed: Fluticasone  nasal spray two sprays in each nostril once a day I have also sent prednisone  for the inflammation in your sinuses and throat.   Providers prescribe antibiotics to treat infections caused by bacteria. Antibiotics are very powerful in treating bacterial infections when they are used properly. To maintain their effectiveness, they should be used only when necessary. Overuse of antibiotics has resulted in the development of superbugs that are resistant to treatment!    After careful review of your answers, I would not recommend an antibiotic for your condition.  Antibiotics are not effective against viruses and therefore should not be used to treat them. Common examples of infections caused by viruses include colds and flu  If you are not feeling better when you complete the prednisone  feel free to reach back out to us .   Some authorities believe that zinc sprays or the use of Echinacea may shorten the course of your symptoms.  Sinus infections are not as easily transmitted as other respiratory infection, however we still recommend that you avoid close contact with loved ones, especially the very young and elderly.  Remember to wash your hands thoroughly throughout the day as  this is the number one way to prevent the spread of infection!  Home Care: Only take medications as instructed by your medical team. Do not take these medications with alcohol. A steam or ultrasonic humidifier can help congestion.  You can place a towel over your head and breathe in the steam from hot water coming from a faucet. Avoid close contacts especially the very young and the elderly. Cover your mouth when you cough or sneeze. Always remember to wash your hands.  Get Help Right Away If: You develop worsening fever or sinus pain. You develop a severe head ache or visual changes. Your symptoms persist after you have completed your treatment plan.  Make sure you Understand these instructions. Will watch your condition. Will get help right away if you are not doing well or get worse.   Thank you for choosing an e-visit.  Your e-visit answers were reviewed by a board certified advanced clinical practitioner to complete your personal care plan. Depending upon the condition, your plan could have included both over the counter or prescription medications.  Please review your pharmacy choice. Make sure the pharmacy is open so you can pick up prescription now. If there is a problem, you may contact your provider through [employer] and have the prescription routed to another pharmacy.  Your safety is important to us . If you have drug allergies check your prescription carefully.   For the next 24 hours you can use MyChart to ask questions about today's visit, request a non-urgent call back, or  ask for a work or school excuse. You will get an email in the next two days asking about your experience. I hope that your e-visit has been valuable and will speed your recovery.

## 2023-06-15 ENCOUNTER — Ambulatory Visit: Payer: Self-pay | Admitting: Physician Assistant

## 2023-06-15 ENCOUNTER — Ambulatory Visit
Admission: EM | Admit: 2023-06-15 | Discharge: 2023-06-15 | Disposition: A | Discharge status: Home / Self Care | Payer: 59 | Attending: Family Medicine | Admitting: Family Medicine

## 2023-06-15 VITALS — BP 138/90 | HR 90 | Temp 98.0°F | Resp 16 | Ht 76.0 in | Wt 215.0 lb

## 2023-06-15 DIAGNOSIS — R220 Localized swelling, mass and lump, head: Secondary | ICD-10-CM | POA: Diagnosis not present

## 2023-06-15 DIAGNOSIS — R6884 Jaw pain: Secondary | ICD-10-CM

## 2023-06-15 DIAGNOSIS — S0340XA Sprain of jaw, unspecified side, initial encounter: Secondary | ICD-10-CM

## 2023-06-15 MED ORDER — PREDNISONE 10 MG (21) PO TBPK: ORAL_TABLET | Freq: Every day | ORAL | 0 refills | Status: DC

## 2023-06-15 MED ORDER — METHYLPREDNISOLONE 4 MG PO TBPK: ORAL_TABLET | ORAL | 0 refills | Status: DC

## 2023-06-15 MED ORDER — CEFDINIR 300 MG PO CAPS: 300.0000 mg | ORAL_CAPSULE | Freq: Two times a day (BID) | ORAL | 0 refills | Status: AC

## 2023-06-15 NOTE — Progress Notes (Signed)
Pt presents today with facial/jaw line pain near lymph as well that started this morning. Previous to this pt had fever, chills and cough this past Saturday 2/08. As of yesterday fever broke, pt feeling better besides new facial pain. Pt states it hurts to chew food as well.

## 2023-06-15 NOTE — Progress Notes (Signed)
   Subjective: Left jaw pain    Patient ID: Jordan Sampson, male    DOB: 1975-07-16, 48 y.o.   MRN: [id number]  HPI Patient complain of left jaw pain and unable to open mouth fully.  Incident occurring status post flulike symptoms last week coughing.  States flu symptoms have resolved. Review of Systems Anxiety and depression.  Elevated blood pressure    Objective:   Physical Exam  06/15/2023   10:14 AM    BP 138/90BP. 138/90. Data is abnormal. Taken on 06/15/23 10:14 AM  Cuff Size Large  Pulse Rate 90  Temp 98 F (36.7 C)  Temp Source Temporal  Weight 215 lb (97.5 kg)  Height 6\' 4"  (1.93 m)  Resp 16  SpO2 93 %  No acute distress. HEENT is remarkable for decreased range of motion opening of the mandible. moderate guarding with palpation of TMJ on the left Neck is supple lymphadenopathy or bruits.   Lungs are clear to auscultation. Heart regular rate and rhythm.    Assessment & Plan: TMJ inflammation  Patient advised to apply warm compress to areas twice a day for approximately 10 minutes.  Patient given prescription of Medrol Dosepak and advised follow-up 4 days if no improvement or worsening complaint.

## 2023-06-15 NOTE — ED Triage Notes (Signed)
Patient reports last Saturday fever, chills, aches, congestion.   COVID test was negative.   Fever broke yesterday.   Woke up this AM and the right side of his face is swollen. Patient reports it is warm and tender.

## 2023-06-15 NOTE — Discharge Instructions (Addendum)
Stop by the pharmacy to pick up your prescriptions. Go to the emergency department for a CT scan, if not improving.

## 2023-06-15 NOTE — ED Provider Notes (Signed)
[facility]    CSN: [id number] Arrival date & time: 06/15/23  1829      History   Chief Complaint Chief Complaint  Patient presents with   Facial Swelling    HPI Jordan Sampson is a 48 y.o. male.   HPI  History obtained from the patient. Jordan Sampson presents for facial swelling that gets worse with eating and drinking. Area is warm and tender to the touch.  Has some dental pain.  Has flu-like symptoms last week.     Past Medical History:  Diagnosis Date   Anxiety associated with depression    Elevated blood pressure    Hypertension    Inappropriate sinus tachycardia (HCC)    associated with exercise   Lightheadedness -spells     Patient Active Problem List   Diagnosis Date Noted   Inappropriate sinus tachycardia/POTS 01/23/2016   Anxiety associated with depression 01/23/2016   Elevated blood pressure 01/23/2016   Family history of premature CAD 04/12/2011   Smoking hx 04/12/2011    Past Surgical History:  Procedure Laterality Date   BIOPSY  04/14/2023   Procedure: BIOPSY;  Surgeon: Regis Bill, MD;  Location: [facility] ENDOSCOPY;  Service: Endoscopy;;   COLONOSCOPY WITH PROPOFOL N/A 10/09/2015   Procedure: COLONOSCOPY WITH PROPOFOL;  Surgeon: Scot Jun, MD;  Location: [facility] ENDOSCOPY;  Service: Endoscopy;  Laterality: N/A;   COLONOSCOPY WITH PROPOFOL N/A 04/14/2023   Procedure: COLONOSCOPY WITH PROPOFOL;  Surgeon: Regis Bill, MD;  Location: [facility] ENDOSCOPY;  Service: Endoscopy;  Laterality: N/A;   ESOPHAGOGASTRODUODENOSCOPY (EGD) WITH PROPOFOL N/A 04/14/2023   Procedure: ESOPHAGOGASTRODUODENOSCOPY (EGD) WITH PROPOFOL;  Surgeon: Regis Bill, MD;  Location: [facility] ENDOSCOPY;  Service: Endoscopy;  Laterality: N/A;       Home Medications    Prior to Admission medications   Medication Sig Start Date End Date Taking? Authorizing Provider  atorvastatin (LIPITOR) 40 MG tablet Take 1 tablet (40 mg total) by mouth daily. 08/25/22  Yes  Joni Reining, PA-C  cefdinir (OMNICEF) 300 MG capsule Take 1 capsule (300 mg total) by mouth 2 (two) times daily for 7 days. 06/15/23 06/22/23 Yes Malina Geers, Seward Meth, DO  celecoxib (CELEBREX) 200 MG capsule Take 1 capsule (200 mg total) by mouth 2 (two) times daily. 02/28/23  Yes Sondra Come, MD  EPINEPHrine 0.3 mg/0.3 mL IJ SOAJ injection Inject 0.3 mg into the muscle as needed for anaphylaxis. 06/13/22  Yes Joni Reining, PA-C  fluticasone [facility]) 50 MCG/ACT nasal spray Place 2 sprays into both nostrils daily. 08/13/21  Yes Margaretann Loveless, PA-C  ipratropium (ATROVENT) 0.03 % nasal spray Place 2 sprays into both nostrils every 12 (twelve) hours. 06/10/23  Yes Claiborne Rigg, NP  pantoprazole (PROTONIX) 40 MG tablet Take 1 tablet (40 mg total) by mouth daily. 01/09/23  Yes Joni Reining, PA-C  predniSONE (STERAPRED UNI-PAK 21 TAB) 10 MG (21) TBPK tablet Take by mouth daily. Take 6 tabs by mouth daily for 1, then 5 tabs for 1 day, then 4 tabs for 1 day, then 3 tabs for 1 day, then 2 tabs for 1 day, then 1 tab for 1 day. 06/15/23  Yes Moyses Pavey, DO  sertraline (ZOLOFT) 100 MG tablet Take 3 tablets (300 mg total) by mouth daily. 02/07/23  Yes Joni Reining, PA-C    Family History Family History  Problem Relation Age of Onset   Coronary artery disease Mother        less than 61 yo  Other Mother 7       triple bypass surgery   Heart disease Mother     Social History Social History   Tobacco Use   Smoking status: Former    Current packs/day: 0.00    Average packs/day: 1 pack/day for 4.0 years (4.0 ttl pk-yrs)    Types: Cigarettes    Start date: 04/12/1995    Quit date: 04/12/1999    Years since quitting: 24.2   Smokeless tobacco: Current    Types: Snuff   Tobacco comments:    1 Can daily  Vaping Use   Vaping status: Never Used  Substance Use Topics   Alcohol use: No   Drug use: No     Allergies   Patient has no known allergies.   Review of Systems Review  of Systems: negative unless otherwise stated in HPI.      Physical Exam Triage Vital Signs ED Triage Vitals  Encounter Vitals Group     BP --      Systolic BP Percentile --      Diastolic BP Percentile --      Pulse Rate 06/15/23 1842 86     Resp --      Temp 06/15/23 1842 99.1 F (37.3 C)     Temp src --      SpO2 06/15/23 1842 96 %     Weight 06/15/23 1840 215 lb (97.5 kg)     Height 06/15/23 1840 6\' 4"  (1.93 m)     Head Circumference --      Peak Flow --      Pain Score 06/15/23 1840 2     Pain Loc --      Pain Education --      Exclude from Growth Chart --    No data found.  Updated Vital Signs BP (!) 143/98 (BP Location: Left Arm)   Pulse 86   Temp 99.1 F (37.3 C)   Ht 6\' 4"  (1.93 m)   Wt 97.5 kg   SpO2 96%   BMI 26.17 kg/m   Visual Acuity Right Eye Distance:   Left Eye Distance:   Bilateral Distance:    Right Eye Near:   Left Eye Near:    Bilateral Near:     Physical Exam GEN:     alert, non-toxic appearing male in no distress    HENT:  mucus membranes moist, oropharyngeal without lesions or erythema, no tonsillar hypertrophy or exudates, no nasal discharge, right TM normal, left TM effusion, left facial edema at the mandible, no palpable masses, no visible dental caries or crack-offs  EYES:   no scleral injection or discharge NECK:  normal ROM, +lymphadenopathy, no meningismus   RESP:  no increased work of breathing Skin:   warm and dry, see HENT above     UC Treatments / Results  Labs (all labs ordered are listed, but only abnormal results are displayed) Labs Reviewed - No data to display  EKG   Radiology No results found.  Procedures Procedures (including critical care time)  Medications Ordered in UC Medications - No data to display  Initial Impression / Assessment and Plan / UC Course  I have reviewed the triage vital signs and the nursing notes.  Pertinent labs & imaging results that were available during my care of the  patient were reviewed by me and considered in my medical decision making (see chart for details).       Pt is a 48 y.o. male who  presents for left sided facial swelling that started yesterday after having flu-like symptoms last week. Muad is afebrile here without recent antipyretics. Satting well on room air. Overall pt is non-toxic appearing, well hydrated, without respiratory distress. He has no dental tenderness with biting. Has some temporal mandibular joint tenderness with TM effusion concerning for temporal-mandibular dysfunction. Considered possible sialadenitis.  Treat wide for above possible causes with antibiotics and steroids as below.  Discussed symptomatic treatment.  Typical duration of symptoms discussed.   Return and ED precautions given and voiced understanding. Discussed MDM, treatment plan and plan for follow-up with patient and his wife who agree with plan.     Final Clinical Impressions(s) / UC Diagnoses   Final diagnoses:  Facial swelling  Jaw pain     Discharge Instructions      Stop by the pharmacy to pick up your prescriptions. Go to the emergency department for a CT scan, if not improving.       ED Prescriptions     Medication Sig Dispense Auth. Provider   cefdinir (OMNICEF) 300 MG capsule Take 1 capsule (300 mg total) by mouth 2 (two) times daily for 7 days. 14 capsule Mychal Decarlo, DO   predniSONE (STERAPRED UNI-PAK 21 TAB) 10 MG (21) TBPK tablet Take by mouth daily. Take 6 tabs by mouth daily for 1, then 5 tabs for 1 day, then 4 tabs for 1 day, then 3 tabs for 1 day, then 2 tabs for 1 day, then 1 tab for 1 day. 21 tablet Katha Cabal, DO      PDMP not reviewed this encounter.   Katha Cabal, DO 06/19/23 1214

## 2023-06-30 ENCOUNTER — Ambulatory Visit: Payer: Self-pay

## 2023-06-30 DIAGNOSIS — Z0000 Encounter for general adult medical examination without abnormal findings: Secondary | ICD-10-CM

## 2023-06-30 LAB — POCT URINALYSIS DIPSTICK
Bilirubin, UA: NEGATIVE
Blood, UA: NEGATIVE
Glucose, UA: NEGATIVE
Ketones, UA: NEGATIVE
Leukocytes, UA: NEGATIVE
Nitrite, UA: NEGATIVE
Protein, UA: NEGATIVE
Spec Grav, UA: 1.02 (ref 1.010–1.025)
Urobilinogen, UA: 0.2 U/dL
pH, UA: 6.5 (ref 5.0–8.0)

## 2023-07-01 LAB — CMP12+LP+TP+TSH+6AC+PSA+CBC…
ALT: 51 IU/L — ABNORMAL HIGH (ref 0–44)
AST: 30 IU/L (ref 0–40)
Albumin: 4.6 g/dL (ref 4.1–5.1)
Alkaline Phosphatase: 119 IU/L (ref 44–121)
BUN/Creatinine Ratio: 11 (ref 9–20)
BUN: 9 mg/dL (ref 6–24)
Basophils Absolute: 0.1 10*3/uL (ref 0.0–0.2)
Basos: 1 %
Bilirubin Total: 1.1 mg/dL (ref 0.0–1.2)
Calcium: 9.3 mg/dL (ref 8.7–10.2)
Chloride: 104 mmol/L (ref 96–106)
Chol/HDL Ratio: 4.2 ratio (ref 0.0–5.0)
Cholesterol, Total: 144 mg/dL (ref 100–199)
Creatinine, Ser: 0.8 mg/dL (ref 0.76–1.27)
EOS (ABSOLUTE): 0.1 10*3/uL (ref 0.0–0.4)
Eos: 2 %
Estimated CHD Risk: 0.8 times avg. (ref 0.0–1.0)
Free Thyroxine Index: 1.5 (ref 1.2–4.9)
GGT: 32 IU/L (ref 0–65)
Globulin, Total: 2.4 g/dL (ref 1.5–4.5)
Glucose: 99 mg/dL (ref 70–99)
HDL: 34 mg/dL — ABNORMAL LOW (ref >39)
Hematocrit: 40.2 % (ref 37.5–51.0)
Hemoglobin: 13.6 g/dL (ref 13.0–17.7)
Immature Grans (Abs): 0 10*3/uL (ref 0.0–0.1)
Immature Granulocytes: 0 %
Iron: 168 ug/dL (ref 38–169)
LDH: 159 IU/L (ref 121–224)
LDL Chol Calc (NIH): 86 mg/dL (ref 0–99)
Lymphocytes Absolute: 2.4 10*3/uL (ref 0.7–3.1)
Lymphs: 37 %
MCH: 29.6 pg (ref 26.6–33.0)
MCHC: 33.8 g/dL (ref 31.5–35.7)
MCV: 87 fL (ref 79–97)
Monocytes Absolute: 0.7 10*3/uL (ref 0.1–0.9)
Monocytes: 10 %
Neutrophils Absolute: 3.3 10*3/uL (ref 1.4–7.0)
Neutrophils: 50 %
Phosphorus: 2.5 mg/dL — ABNORMAL LOW (ref 2.8–4.1)
Platelets: 384 10*3/uL (ref 150–450)
Potassium: 4.4 mmol/L (ref 3.5–5.2)
Prostate Specific Ag, Serum: 0.6 ng/mL (ref 0.0–4.0)
RBC: 4.6 x10E6/uL (ref 4.14–5.80)
RDW: 13.2 % (ref 11.6–15.4)
Sodium: 141 mmol/L (ref 134–144)
T3 Uptake Ratio: 23 % — ABNORMAL LOW (ref 24–39)
T4, Total: 6.6 ug/dL (ref 4.5–12.0)
TSH: 0.991 u[IU]/mL (ref 0.450–4.500)
Total Protein: 7 g/dL (ref 6.0–8.5)
Triglycerides: 135 mg/dL (ref 0–149)
Uric Acid: 4.4 mg/dL (ref 3.8–8.4)
VLDL Cholesterol Cal: 24 mg/dL (ref 5–40)
WBC: 6.6 10*3/uL (ref 3.4–10.8)
eGFR: 108 mL/min/{1.73_m2} (ref >59)

## 2023-07-06 ENCOUNTER — Encounter: Payer: Self-pay | Admitting: Physician Assistant

## 2023-07-06 ENCOUNTER — Ambulatory Visit: Payer: Self-pay | Admitting: Physician Assistant

## 2023-07-06 VITALS — BP 121/82 | Resp 14 | Ht 76.0 in | Wt 215.0 lb

## 2023-07-06 DIAGNOSIS — Z0000 Encounter for general adult medical examination without abnormal findings: Secondary | ICD-10-CM

## 2023-07-06 NOTE — Progress Notes (Signed)
 Pt presents today to complete physical. Pt did not voice any concerns at this time. Jordan Sampson

## 2023-07-06 NOTE — Progress Notes (Signed)
 [facility]  ____________________________________________   None    (approximate)  I have reviewed the triage vital signs and the nursing notes.   HISTORY  Chief Complaint No chief complaint on file.   HPI Jordan Sampson is a 48 y.o. male patient presents for annual physical exam.  Voices no concerns or complaints         Past Medical History:  Diagnosis Date   Anxiety associated with depression    Elevated blood pressure    Hypertension    Inappropriate sinus tachycardia (HCC)    associated with exercise   Lightheadedness -spells     Patient Active Problem List   Diagnosis Date Noted   Inappropriate sinus tachycardia/POTS 01/23/2016   Anxiety associated with depression 01/23/2016   Elevated blood pressure 01/23/2016   Family history of premature CAD 04/12/2011   Smoking hx 04/12/2011    Past Surgical History:  Procedure Laterality Date   BIOPSY  04/14/2023   Procedure: BIOPSY;  Surgeon: Regis Bill, MD;  Location: [facility] ENDOSCOPY;  Service: Endoscopy;;   COLONOSCOPY WITH PROPOFOL N/A 10/09/2015   Procedure: COLONOSCOPY WITH PROPOFOL;  Surgeon: Scot Jun, MD;  Location: [facility] ENDOSCOPY;  Service: Endoscopy;  Laterality: N/A;   COLONOSCOPY WITH PROPOFOL N/A 04/14/2023   Procedure: COLONOSCOPY WITH PROPOFOL;  Surgeon: Regis Bill, MD;  Location: [facility] ENDOSCOPY;  Service: Endoscopy;  Laterality: N/A;   ESOPHAGOGASTRODUODENOSCOPY (EGD) WITH PROPOFOL N/A 04/14/2023   Procedure: ESOPHAGOGASTRODUODENOSCOPY (EGD) WITH PROPOFOL;  Surgeon: Regis Bill, MD;  Location: [facility] ENDOSCOPY;  Service: Endoscopy;  Laterality: N/A;    Prior to Admission medications   Medication Sig Start Date End Date Taking? Authorizing Provider  atorvastatin (LIPITOR) 40 MG tablet Take 1 tablet (40 mg total) by mouth daily. 08/25/22   Joni Reining, PA-C  celecoxib (CELEBREX) 200 MG capsule Take 1 capsule (200 mg total) by mouth  2 (two) times daily. 02/28/23   Sondra Come, MD  EPINEPHrine 0.3 mg/0.3 mL IJ SOAJ injection Inject 0.3 mg into the muscle as needed for anaphylaxis. 06/13/22   Joni Reining, PA-C  fluticasone (FLONASE) 50 MCG/ACT nasal spray Place 2 sprays into both nostrils daily. 08/13/21   Margaretann Loveless, PA-C  ipratropium (ATROVENT) 0.03 % nasal spray Place 2 sprays into both nostrils every 12 (twelve) hours. 06/10/23   Claiborne Rigg, NP  pantoprazole (PROTONIX) 40 MG tablet Take 1 tablet (40 mg total) by mouth daily. 01/09/23   Joni Reining, PA-C  predniSONE (STERAPRED UNI-PAK 21 TAB) 10 MG (21) TBPK tablet Take by mouth daily. Take 6 tabs by mouth daily for 1, then 5 tabs for 1 day, then 4 tabs for 1 day, then 3 tabs for 1 day, then 2 tabs for 1 day, then 1 tab for 1 day. 06/15/23   Katha Cabal, DO  sertraline (ZOLOFT) 100 MG tablet Take 3 tablets (300 mg total) by mouth daily. 02/07/23   Joni Reining, PA-C    Allergies Patient has no known allergies.  Family History  Problem Relation Age of Onset   Coronary artery disease Mother        less than 90 yo   Other Mother 54       triple bypass surgery   Heart disease Mother     Social History Social History   Tobacco Use   Smoking status: Former    Current packs/day: 0.00    Average packs/day: 1 pack/day for 4.0  years (4.0 ttl pk-yrs)    Types: Cigarettes    Start date: 04/12/1995    Quit date: 04/12/1999    Years since quitting: 24.2   Smokeless tobacco: Current    Types: Snuff   Tobacco comments:    1 Can daily  Vaping Use   Vaping status: Never Used  Substance Use Topics   Alcohol use: No   Drug use: No    Review of Systems Constitutional: No fever/chills Eyes: No visual changes. ENT: No sore throat. Cardiovascular: Denies chest pain. Respiratory: Denies shortness of breath. Gastrointestinal: No abdominal pain.  No nausea, no vomiting.  No diarrhea.  No constipation. Genitourinary: Negative for  dysuria. Musculoskeletal: Negative for back pain. Skin: Negative for rash. Neurological: Negative for headaches, focal weakness or numbness. Psychiatric: Anxiety depression ______________   PHYSICAL EXAM:  VITAL SIGNS: BP 121/82  Cuff Size Large  Weight 215 lb (97.5 kg)  Height 6\' 4"  (1.93 m)  Resp 14  SpO2 96 %   Other Vitals   BMI: 26.17 kg/m2  BSA: 2.29 m2   Constitutional: Alert and oriented. Well appearing and in no acute distress. Eyes: Conjunctivae are normal. PERRL. EOMI. Head: Atraumatic. Nose: No congestion/rhinnorhea. Mouth/Throat: Mucous membranes are moist.  Oropharynx non-erythematous. Neck: No stridor. No cervical spine tenderness to palpation. Hematological/Lymphatic/Immunilogical: No cervical lymphadenopathy. Cardiovascular: Normal rate, regular rhythm. Grossly normal heart sounds.  Good peripheral circulation. Respiratory: Normal respiratory effort.  No retractions. Lungs CTAB. Gastrointestinal: Soft and nontender. No distention. No abdominal bruits. No CVA tenderness. Genitourinary: Deferred Musculoskeletal: No lower extremity tenderness nor edema.  No joint effusions. Neurologic:  Normal speech and language. No gross focal neurologic deficits are appreciated. No gait instability. Skin:  Skin is warm, dry and intact. No rash noted. Psychiatric: Mood and affect are normal. Speech and behavior are normal.  ____________________________________________   LABS Other Vitals   BMI: 26.17 kg/m2  BSA: 2.29 m2  ____________________________________________  EKG  Sinus rhythm at 66 bpm.  Left atrial enlargement.  Asymptomatic. ____________________________________________    ____________________________________________   INITIAL IMPRESSION / ASSESSMENT AND PLAN As part of my medical decision making, I reviewed the following data within the electronic MEDICAL RECORD NUMBER       No acute findings on physical exam, EKG,  labs.     ____________________________________________   FINAL CLINICAL IMPRESSION  Well exam ED Discharge Orders          Ordered    EKG 12-Lead        07/06/23 1478             Note:  This document was prepared using Dragon voice recognition software and may include unintentional dictation errors.

## 2023-07-18 ENCOUNTER — Other Ambulatory Visit: Payer: Self-pay

## 2023-07-18 ENCOUNTER — Telehealth: Payer: Self-pay

## 2023-07-18 DIAGNOSIS — K219 Gastro-esophageal reflux disease without esophagitis: Secondary | ICD-10-CM | POA: Insufficient documentation

## 2023-07-18 NOTE — Telephone Encounter (Signed)
 error

## 2023-07-18 NOTE — Telephone Encounter (Signed)
 Received notification from [facility]) that Pantoprazole needs prior authorization.  Prior Authorization sumbitted through CoverMyMeds. [place] approved from 07/18/2023 to 07/17/2026 as long as there are no changes to health insurance benefits.  Date: 07/18/2023 Jordan Sampson [address] [place], [place] [address] RE: Melina Fiddler approved your request for coverage of Pantoprazole Sodium 40MG  OR TBEC. Dear Molli Hazard Menton: Verlon Au pleased to let you know that we've approved your or your doctor's request for coverage for Pantoprazole Sodium 40MG  OR TBEC. You can now fill your prescription, and it will be covered according to your plan. As long as you remain covered by your prescription drug plan and there are no changes to your plan benefits, this request is approved from 07/18/2023 to 07/17/2026. When this approval expires, please speak to your doctor about your treatment. Sincerely, [facility] cc: Dr. Nona Sampson If you have questions, we want to help. Call the number on your prescription ID card or in your plan materials to speak with a representative.

## 2023-08-07 ENCOUNTER — Other Ambulatory Visit: Payer: Self-pay

## 2023-08-07 DIAGNOSIS — E782 Mixed hyperlipidemia: Secondary | ICD-10-CM

## 2023-08-07 MED ORDER — ATORVASTATIN CALCIUM 40 MG PO TABS: 40.0000 mg | ORAL_TABLET | Freq: Every day | ORAL | 3 refills | Status: AC

## 2023-10-24 ENCOUNTER — Ambulatory Visit: Payer: Self-pay | Admitting: Physician Assistant

## 2023-10-24 DIAGNOSIS — K2100 Gastro-esophageal reflux disease with esophagitis, without bleeding: Secondary | ICD-10-CM

## 2023-10-24 MED ORDER — ESOMEPRAZOLE MAGNESIUM 40 MG PO CPDR: 40.0000 mg | DELAYED_RELEASE_CAPSULE | Freq: Every day | ORAL | 0 refills | Status: DC

## 2023-10-24 NOTE — Progress Notes (Signed)
 Pt presents today with acid reflux getting worse over the past three weeks. Pt wakes up in the morning with it lasting all day long. Pt believes medication is no longer working. (Pantoprazole )

## 2023-10-24 NOTE — Progress Notes (Signed)
   Subjective: Acid reflux    Patient ID: Jordan Sampson, male    DOB: 1975/08/14, 48 y.o.   MRN: [id number]  HPI Patient states Protonix  no longer controlled describes reflux.  Patient states he has tried taking it in the morning and in the evening with no noticeable improvement.  Patient had a upper GI series which showed esophageal dysphagia and mild erosion.  This procedure was performed in September 2024.   Review of Systems Anxiety/depression, GERD, and hyperlipidemia.    Objective:   Physical Exam Physical exam deferred.       Assessment & Plan: GERD.   Patient is amenable to a trial on Nexium 40 mg every morning.  Patient advised to give medication approximately 2 weeks to see if it is effective.  If problem persist will consider follow-up with gastroenterology.

## 2023-11-06 ENCOUNTER — Ambulatory Visit: Payer: Self-pay | Admitting: Physician Assistant

## 2023-11-06 ENCOUNTER — Encounter: Payer: Self-pay | Admitting: Physician Assistant

## 2023-11-06 VITALS — BP 118/82 | HR 74 | Temp 97.8°F | Resp 16

## 2023-11-06 DIAGNOSIS — K2100 Gastro-esophageal reflux disease with esophagitis, without bleeding: Secondary | ICD-10-CM

## 2023-11-06 NOTE — Progress Notes (Signed)
 Reports no relief at all with reflux trying Nexium  since last visit.

## 2023-11-06 NOTE — Progress Notes (Signed)
   Subjective: GERD    Patient ID: Jordan Sampson, male    DOB: Feb 20, 1976, 48 y.o.   MRN: [id number]  HPI Patient follow-up for GERD.  Patient states he was on long-term Protonix  and found it was ineffective.  2 weeks ago patient was started on Nexium  and stated noticed no improvement.  Patient has a history of voiced concern secondary to family history.  States daily sensation of esophageal dysphagia and mild erosion.     Review of Systems Anxiety/depression GERD. POTS.    Objective:   Physical Exam  Deferred      Assessment & Plan: GERD.   Advised patient to continue Nexium  and will consult to gastroenterology for further evaluation and treatment.

## 2023-11-06 NOTE — Progress Notes (Signed)
 Matt had Endoscopy & Colonoscopy 12.13/2024 by Dr. Ole Schick.  Advised that he's still a patient of theirs & to call Dr. Calton office & schedule appointment.  Gave him the phone number for [facility]. If he has any issues scheduling his own appointment, advised him to call me & we'll send another referral.

## 2023-11-22 ENCOUNTER — Other Ambulatory Visit: Payer: Self-pay

## 2023-11-22 DIAGNOSIS — K2100 Gastro-esophageal reflux disease with esophagitis, without bleeding: Secondary | ICD-10-CM

## 2023-11-22 MED ORDER — ESOMEPRAZOLE MAGNESIUM 40 MG PO CPDR: 40.0000 mg | DELAYED_RELEASE_CAPSULE | Freq: Every day | ORAL | 0 refills | Status: DC

## 2024-01-02 ENCOUNTER — Other Ambulatory Visit: Payer: Self-pay

## 2024-01-02 DIAGNOSIS — K2100 Gastro-esophageal reflux disease with esophagitis, without bleeding: Secondary | ICD-10-CM

## 2024-01-02 MED ORDER — ESOMEPRAZOLE MAGNESIUM 40 MG PO CPDR: 40.0000 mg | DELAYED_RELEASE_CAPSULE | Freq: Every day | ORAL | 0 refills | Status: AC

## 2024-01-10 ENCOUNTER — Telehealth: Payer: Self-pay

## 2024-01-10 NOTE — Telephone Encounter (Signed)
 Error

## 2024-02-07 ENCOUNTER — Telehealth: Payer: Self-pay

## 2024-02-07 NOTE — Telephone Encounter (Signed)
 Pt called the triage line stating he is having left scrotal pain. Pt states he felt better while on celebrex , prescription ran out. Pt wanted to see if he could get a refill or if he needed to come in to discuss other options. Please advise.

## 2024-02-08 ENCOUNTER — Ambulatory Visit: Admitting: Urology

## 2024-02-08 VITALS — BP 128/77 | HR 68 | Wt 210.0 lb

## 2024-02-08 DIAGNOSIS — R102 Pelvic and perineal pain unspecified side: Secondary | ICD-10-CM | POA: Diagnosis not present

## 2024-02-08 MED ORDER — CELECOXIB 200 MG PO CAPS: 200.0000 mg | ORAL_CAPSULE | Freq: Two times a day (BID) | ORAL | 0 refills | Status: AC

## 2024-02-08 NOTE — Progress Notes (Signed)
   02/08/2024 10:06 AM   Donnice LITTIE Moats 1975-10-27 [id number]  Reason for visit: Left scrotal pain  History: Originally seen October 2024 with left scrotal pain with benign scrotal ultrasound Symptoms improved with Celebrex  and pelvic floor stretching exercises Microscopic hematuria and renal ultrasound normal, he deferred cystoscopy  Physical Exam: BP 128/77 (BP Location: Left Arm, Patient Position: Sitting, Cuff Size: Large)   Pulse 68   Wt 210 lb (95.3 kg)   SpO2 97%   BMI 25.56 kg/m  Testicles 20 cc and descended bilaterally, no lesions or masses, mild left testicular tenderness   Today: Left testicular soreness/pain for the last few weeks, similar to prior presentation that resolved with Celebrex  Denies any urinary symptoms, dysuria, new sexual partners, or gross hematuria  Plan:   Left scrotal pain: Likely pelvic floor/MSK related, repeat trial of Celebrex  and pelvic floor stretching exercises.  Consider referral to Dr. Lovie in [place] if persistent/recurrent symptoms   Redell JAYSON Burnet, MD  [facility] [address] [place], [place] [address] [phone]

## 2024-02-08 NOTE — Patient Instructions (Signed)
 SABRA

## 2024-02-14 DIAGNOSIS — K219 Gastro-esophageal reflux disease without esophagitis: Secondary | ICD-10-CM | POA: Diagnosis not present

## 2024-02-14 DIAGNOSIS — Z800 Family history of malignant neoplasm of digestive organs: Secondary | ICD-10-CM | POA: Diagnosis not present

## 2024-03-19 ENCOUNTER — Ambulatory Visit: Admitting: Urology

## 2024-03-22 ENCOUNTER — Other Ambulatory Visit: Payer: Self-pay

## 2024-03-22 DIAGNOSIS — F418 Other specified anxiety disorders: Secondary | ICD-10-CM

## 2024-03-22 MED ORDER — SERTRALINE HCL 100 MG PO TABS: 300.0000 mg | ORAL_TABLET | Freq: Every day | ORAL | 3 refills | Status: AC
